# Patient Record
Sex: Female | Born: 1975
Health system: Southern US, Community
[De-identification: ages and names within clinical notes are randomized; demographics above are authoritative.]

## PROBLEM LIST (undated history)

## (undated) DIAGNOSIS — E039 Hypothyroidism, unspecified: Secondary | ICD-10-CM

## (undated) DIAGNOSIS — E119 Type 2 diabetes mellitus without complications: Secondary | ICD-10-CM

## (undated) DIAGNOSIS — I10 Essential (primary) hypertension: Secondary | ICD-10-CM

## (undated) DIAGNOSIS — R519 Headache, unspecified: Secondary | ICD-10-CM

## (undated) DIAGNOSIS — R7303 Prediabetes: Secondary | ICD-10-CM

## (undated) DIAGNOSIS — D259 Leiomyoma of uterus, unspecified: Secondary | ICD-10-CM

## (undated) DIAGNOSIS — E079 Disorder of thyroid, unspecified: Secondary | ICD-10-CM

## (undated) DIAGNOSIS — N9489 Other specified conditions associated with female genital organs and menstrual cycle: Secondary | ICD-10-CM

## (undated) HISTORY — PX: ABDOMINAL HYSTERECTOMY: SHX81

## (undated) HISTORY — DX: Hypothyroidism, unspecified: E03.9

## (undated) HISTORY — DX: Other specified conditions associated with female genital organs and menstrual cycle: N94.89

## (undated) HISTORY — DX: Leiomyoma of uterus, unspecified: D25.9

## (undated) HISTORY — DX: Prediabetes: R73.03

---

## 2018-09-14 DIAGNOSIS — M7062 Trochanteric bursitis, left hip: Secondary | ICD-10-CM | POA: Insufficient documentation

## 2018-09-14 DIAGNOSIS — M25552 Pain in left hip: Secondary | ICD-10-CM | POA: Insufficient documentation

## 2018-12-22 ENCOUNTER — Encounter (HOSPITAL_COMMUNITY): Payer: Self-pay

## 2018-12-22 ENCOUNTER — Ambulatory Visit (HOSPITAL_COMMUNITY)
Admission: EM | Admit: 2018-12-22 | Discharge: 2018-12-22 | Disposition: A | Discharge status: Home / Self Care | Payer: 59 | Attending: Family Medicine | Admitting: Family Medicine

## 2018-12-22 ENCOUNTER — Other Ambulatory Visit: Payer: Self-pay

## 2018-12-22 DIAGNOSIS — R509 Fever, unspecified: Secondary | ICD-10-CM | POA: Diagnosis not present

## 2018-12-22 DIAGNOSIS — Z711 Person with feared health complaint in whom no diagnosis is made: Secondary | ICD-10-CM

## 2018-12-22 HISTORY — DX: Type 2 diabetes mellitus without complications: E11.9

## 2018-12-22 HISTORY — DX: Disorder of thyroid, unspecified: E07.9

## 2018-12-22 NOTE — ED Notes (Signed)
Patient verbalizes understanding of discharge instructions. Opportunity for questioning and answers were provided. Patient discharged from UCC by RN.  

## 2018-12-22 NOTE — ED Triage Notes (Signed)
Patient presents to Urgent Care with complaints of low grade fever, for which she has been taking Tylenol since four days ago.

## 2018-12-22 NOTE — Discharge Instructions (Addendum)
Get plenty of rest and push fluids Take OTC tylenol as needed for fever when greater than 100F, body aches, and/or chills Follow up with PCP or Community health as needed Please call or go to the ED if you have any new or worsening symptoms such as high fever, worsening cough, shortness of breath, chest tightness, chest pain, turning blue, changes in mental status, etc..Marland Kitchen

## 2018-12-22 NOTE — ED Provider Notes (Signed)
[place]   [id number] 12/22/18 Arrival Time: 4098  CC: FEVER  SUBJECTIVE: History from: patient.  Alyssa Oliver is a 43 y.o. female hx significant for DM, and thyroid disease, who presents with complaint of subjective low grade fever that began 4 days ago.  Tmax at home was 99, 98.3 in office today.  Last dose of tylenol last night.  States temperature ranges from 97-99. Admits to possible sick exposure to daughter. Denies travel.  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 Has tried OTC tylenol with relief.  Denies aggravating or alleviating factors. Denies chills, fatigue, body aches, rhinorrhea, congestion, sore throat, cough, chest pain, chest tightness, SOB, abdominal pain, changes in bowel or bladder function.     Received flu shot this year: yes.  ROS: As per HPI.  Past Medical History:  Diagnosis Date  . Diabetes mellitus without complication ([place])   . Thyroid disease    History reviewed. No pertinent surgical history. No Known Allergies No current facility-administered medications on file prior to encounter.    Current Outpatient Medications on File Prior to Encounter  Medication Sig Dispense Refill  . levothyroxine (SYNTHROID, LEVOTHROID) 50 MCG tablet Take 50 mcg by mouth daily before breakfast.    . metFORMIN (GLUCOPHAGE) 500 MG tablet Take by mouth 2 (two) times daily with a meal.     Social History   Socioeconomic History  . Marital status: Married    Spouse name: Not on file  . Number of children: Not on file  . Years of education: Not on file  . Highest education level: Not on file  Occupational History  . Not on file  Social Needs  . Financial resource strain: Not on file  . Food insecurity:    Worry: Not on file    Inability: Not on file  . Transportation needs:    Medical: Not on file    Non-medical: Not on file  Tobacco Use  . Smoking status: Never Smoker  Substance and Sexual Activity  . Alcohol use: Not Currently  . Drug use: Not  on file  . Sexual activity: Not Currently  Lifestyle  . Physical activity:    Days per week: Not on file    Minutes per session: Not on file  . Stress: Not on file  Relationships  . Social connections:    Talks on phone: Not on file    Gets together: Not on file    Attends religious service: Not on file    Active member of club or organization: Not on file    Attends meetings of clubs or organizations: Not on file    Relationship status: Not on file  . Intimate partner violence:    Fear of current or ex partner: Not on file    Emotionally abused: Not on file    Physically abused: Not on file    Forced sexual activity: Not on file  Other Topics Concern  . Not on file  Social History Narrative  . Not on file   History reviewed. No pertinent family history.  OBJECTIVE:  Vitals:   12/22/18 1450 12/22/18 1451  BP:  (!) 147/77  Pulse: 95   Resp: 18   Temp: 98.3 F (36.8 C)   TempSrc: Oral   SpO2: 100%      General appearance: alert; well appearing; nontoxic appearance HEENT: NCAT; Ears: EACs clear, TMs pearly gray; Eyes: EOM grossly intact. Nose: no rhinorrhea without nasal flaring, turbinates mildly boggy; Throat: oropharynx clear, tonsils  not enlarged or erythematous, uvula midline Neck: supple without LAD Lungs: CTA bilaterally without adventitious breath sounds; normal respiratory effort, no belly breathing or accessory muscle use; no cough present Heart: regular rate and rhythm.  Radial pulses 2+ symmetrical bilaterally Skin: warm and dry; no obvious rashes Psychological: alert and cooperative; normal mood and affect appropriate for age   ASSESSMENT & PLAN:  1. Physically well but worried     Get plenty of rest and push fluids Take OTC tylenol as needed for fever when greater than 100 F, body aches, and/or chills Follow up with PCP or Community health as needed Please call or go to the ED if you have any new or worsening symptoms such as high fever, worsening  cough, shortness of breath, chest tightness, chest pain, turning blue, changes in mental status, etc...  Reviewed expectations re: course of current medical issues. Questions answered. Outlined signs and symptoms indicating need for more acute intervention. Patient verbalized understanding. After Visit Summary given.          Lestine Box, PA-C 12/22/18 1533

## 2019-03-16 ENCOUNTER — Encounter (HOSPITAL_COMMUNITY): Payer: Self-pay

## 2019-03-16 ENCOUNTER — Emergency Department (HOSPITAL_COMMUNITY): Payer: No Typology Code available for payment source

## 2019-03-16 ENCOUNTER — Other Ambulatory Visit: Payer: Self-pay

## 2019-03-16 ENCOUNTER — Observation Stay (HOSPITAL_COMMUNITY)
Admission: EM | Admit: 2019-03-16 | Discharge: 2019-03-18 | Disposition: A | Discharge status: Home / Self Care | Payer: No Typology Code available for payment source | Attending: Surgery | Admitting: Surgery

## 2019-03-16 ENCOUNTER — Ambulatory Visit (INDEPENDENT_AMBULATORY_CARE_PROVIDER_SITE_OTHER)
Admission: EM | Admit: 2019-03-16 | Discharge: 2019-03-16 | Disposition: A | Discharge status: Home / Self Care | Payer: No Typology Code available for payment source | Source: Home / Self Care

## 2019-03-16 DIAGNOSIS — R1011 Right upper quadrant pain: Secondary | ICD-10-CM

## 2019-03-16 DIAGNOSIS — K3580 Unspecified acute appendicitis: Secondary | ICD-10-CM | POA: Diagnosis not present

## 2019-03-16 DIAGNOSIS — E669 Obesity, unspecified: Secondary | ICD-10-CM | POA: Insufficient documentation

## 2019-03-16 DIAGNOSIS — Z7984 Long term (current) use of oral hypoglycemic drugs: Secondary | ICD-10-CM | POA: Insufficient documentation

## 2019-03-16 DIAGNOSIS — Z79890 Hormone replacement therapy (postmenopausal): Secondary | ICD-10-CM | POA: Diagnosis not present

## 2019-03-16 DIAGNOSIS — E039 Hypothyroidism, unspecified: Secondary | ICD-10-CM | POA: Insufficient documentation

## 2019-03-16 DIAGNOSIS — Z1159 Encounter for screening for other viral diseases: Secondary | ICD-10-CM | POA: Insufficient documentation

## 2019-03-16 DIAGNOSIS — E119 Type 2 diabetes mellitus without complications: Secondary | ICD-10-CM | POA: Insufficient documentation

## 2019-03-16 DIAGNOSIS — Z79899 Other long term (current) drug therapy: Secondary | ICD-10-CM | POA: Insufficient documentation

## 2019-03-16 DIAGNOSIS — Z6838 Body mass index (BMI) 38.0-38.9, adult: Secondary | ICD-10-CM | POA: Diagnosis not present

## 2019-03-16 DIAGNOSIS — R1010 Upper abdominal pain, unspecified: Secondary | ICD-10-CM

## 2019-03-16 LAB — URINALYSIS, ROUTINE W REFLEX MICROSCOPIC
Bilirubin Urine: NEGATIVE
Glucose, UA: NEGATIVE mg/dL
Hgb urine dipstick: NEGATIVE
Ketones, ur: 5 mg/dL — AB
Leukocytes,Ua: NEGATIVE
Nitrite: NEGATIVE
Protein, ur: NEGATIVE mg/dL
Specific Gravity, Urine: 1.019 (ref 1.005–1.030)
pH: 8 (ref 5.0–8.0)

## 2019-03-16 LAB — CBC
HCT: 31.6 % — ABNORMAL LOW (ref 36.0–46.0)
Hemoglobin: 9.9 g/dL — ABNORMAL LOW (ref 12.0–15.0)
MCH: 27.2 pg (ref 26.0–34.0)
MCHC: 31.3 g/dL (ref 30.0–36.0)
MCV: 86.8 fL (ref 80.0–100.0)
Platelets: 434 10*3/uL — ABNORMAL HIGH (ref 150–400)
RBC: 3.64 MIL/uL — ABNORMAL LOW (ref 3.87–5.11)
RDW: 18.9 % — ABNORMAL HIGH (ref 11.5–15.5)
WBC: 10.2 10*3/uL (ref 4.0–10.5)
nRBC: 0 % (ref 0.0–0.2)

## 2019-03-16 LAB — COMPREHENSIVE METABOLIC PANEL
ALT: 14 U/L (ref 0–44)
AST: 17 U/L (ref 15–41)
Albumin: 3.9 g/dL (ref 3.5–5.0)
Alkaline Phosphatase: 54 U/L (ref 38–126)
Anion gap: 8 (ref 5–15)
BUN: 7 mg/dL (ref 6–20)
CO2: 24 mmol/L (ref 22–32)
Calcium: 9.1 mg/dL (ref 8.9–10.3)
Chloride: 103 mmol/L (ref 98–111)
Creatinine, Ser: 0.57 mg/dL (ref 0.44–1.00)
GFR calc Af Amer: >60 mL/min (ref >60)
GFR calc non Af Amer: >60 mL/min (ref >60)
Glucose, Bld: 120 mg/dL — ABNORMAL HIGH (ref 70–99)
Potassium: 3.4 mmol/L — ABNORMAL LOW (ref 3.5–5.1)
Sodium: 135 mmol/L (ref 135–145)
Total Bilirubin: 0.2 mg/dL — ABNORMAL LOW (ref 0.3–1.2)
Total Protein: 6.9 g/dL (ref 6.5–8.1)

## 2019-03-16 LAB — I-STAT BETA HCG BLOOD, ED (MC, WL, AP ONLY): I-stat hCG, quantitative: <5 m[IU]/mL (ref <5)

## 2019-03-16 LAB — LIPASE, BLOOD: Lipase: 23 U/L (ref 11–51)

## 2019-03-16 MED ORDER — MORPHINE SULFATE (PF) 2 MG/ML IV SOLN
2.0000 mg | Freq: Once | INTRAVENOUS | Status: AC
  Administered 2019-03-16: 2 mg via INTRAVENOUS
  Filled 2019-03-16: qty 1

## 2019-03-16 MED ORDER — SODIUM CHLORIDE 0.9% FLUSH
3.0000 mL | Freq: Once | INTRAVENOUS | Status: AC
  Administered 2019-03-17: 3 mL via INTRAVENOUS

## 2019-03-16 MED ORDER — ALUM & MAG HYDROXIDE-SIMETH 200-200-20 MG/5ML PO SUSP
30.0000 mL | Freq: Once | ORAL | Status: AC
  Administered 2019-03-16: 30 mL via ORAL

## 2019-03-16 MED ORDER — ALUM & MAG HYDROXIDE-SIMETH 200-200-20 MG/5ML PO SUSP
ORAL | Status: AC
  Filled 2019-03-16: qty 30

## 2019-03-16 MED ORDER — LIDOCAINE VISCOUS HCL 2 % MT SOLN
15.0000 mL | Freq: Once | OROMUCOSAL | Status: AC
  Administered 2019-03-16: 15 mL via ORAL

## 2019-03-16 MED ORDER — LIDOCAINE VISCOUS HCL 2 % MT SOLN
OROMUCOSAL | Status: AC
  Filled 2019-03-16: qty 15

## 2019-03-16 NOTE — ED Provider Notes (Addendum)
[facility]    CSN: [id number] Arrival date & time: 03/16/19  [place]     History   Chief Complaint Chief Complaint  Patient presents with  . Abdominal Pain    HPI Alyssa Oliver is a 43 y.o. female history of DM type II, presenting today for evaluation of abdominal pain.  Patient states that her pain began today around 12 PM.  Since pain is worsened.  Describes pain as a 10 out of 10.  Located in her upper abdomen.  Describes it as sharp.  Denies radiation to back, but occasionally feeling cramping sensation in her back.  Has had some associated nausea, but denies vomiting.  Denies change in bowel movements, constipation or diarrhea.  Denies blood in stool.  Denies previous surgeries on abdomen.  She has taken some Mylanta without relief.  Notes that she ate salmon last night and was unsure if this could be related to her pain.  Denies associated chest pain or shortness of breath.  HPI  Past Medical History:  Diagnosis Date  . Diabetes mellitus without complication ([place])   . Thyroid disease     There are no active problems to display for this patient.   History reviewed. No pertinent surgical history.  OB History   No obstetric history on file.      Home Medications    Prior to Admission medications   Medication Sig Start Date End Date Taking? Authorizing Provider  levothyroxine (SYNTHROID, LEVOTHROID) 50 MCG tablet Take 50 mcg by mouth daily before breakfast.    [provider]  metFORMIN (GLUCOPHAGE) 500 MG tablet Take by mouth 2 (two) times daily with a meal.    [provider]    Family History History reviewed. No pertinent family history.  Social History Social History   Tobacco Use  . Smoking status: Never Smoker  . Smokeless tobacco: Never Used  Substance Use Topics  . Alcohol use: Not Currently  . Drug use: Not on file     Allergies   Patient has no known allergies.   Review of Systems Review of Systems   Constitutional: Negative for activity change, appetite change, chills, fatigue and fever.  HENT: Negative for congestion, ear pain, rhinorrhea, sinus pressure, sore throat and trouble swallowing.   Eyes: Negative for discharge and redness.  Respiratory: Negative for cough, chest tightness and shortness of breath.   Cardiovascular: Negative for chest pain.  Gastrointestinal: Positive for abdominal pain. Negative for diarrhea, nausea and vomiting.  Musculoskeletal: Negative for myalgias.  Skin: Negative for rash.  Neurological: Negative for dizziness, light-headedness and headaches.     Physical Exam Triage Vital Signs ED Triage Vitals  Enc Vitals Group     BP 03/16/19 1835 139/63     Pulse Rate 03/16/19 1835 78     Resp 03/16/19 1835 18     Temp 03/16/19 1835 98.2 F (36.8 C)     Temp src --      SpO2 03/16/19 1835 100 %     Weight 03/16/19 1833 230 lb (104.3 kg)     Height --      Head Circumference --      Peak Flow --      Pain Score 03/16/19 1833 10     Pain Loc --      Pain Edu? --      Excl. in [place]? --    No data found.  Updated Vital Signs BP 139/63   Pulse 78   Temp  98.2 F (36.8 C)   Resp 18   Wt 230 lb (104.3 kg)   LMP 02/24/2019   SpO2 100%   Visual Acuity Right Eye Distance:   Left Eye Distance:   Bilateral Distance:    Right Eye Near:   Left Eye Near:    Bilateral Near:     Physical Exam Vitals signs and nursing note reviewed.  Constitutional:      Appearance: She is well-developed.     Comments: Appears slightly uncomfortable  HENT:     Head: Normocephalic and atraumatic.     Mouth/Throat:     Comments: Oral mucosa pink and moist, no tonsillar enlargement or exudate. Posterior pharynx patent and nonerythematous, no uvula deviation or swelling. Normal phonation. Eyes:     Conjunctiva/sclera: Conjunctivae normal.  Neck:     Musculoskeletal: Neck supple.  Cardiovascular:     Rate and Rhythm: Normal rate and regular rhythm.     Heart  sounds: No murmur.  Pulmonary:     Effort: Pulmonary effort is normal. No respiratory distress.     Breath sounds: Normal breath sounds.     Comments: Slightly tachypnic, breathing short and quick, CTABL, no wheezing, rales or other adventitious sounds auscultated;  Abdominal:     Palpations: Abdomen is soft.     Tenderness: There is abdominal tenderness.     Comments: Diffuse tenderness throughout upper abdomen, more focal in mid abdomen, negative rebound; nontender in bilateral lower quadrants  Skin:    General: Skin is warm and dry.  Neurological:     Mental Status: She is alert.      UC Treatments / Results  Labs (all labs ordered are listed, but only abnormal results are displayed) Labs Reviewed - No data to display  EKG None  Radiology No results found.  Procedures Procedures (including critical care time)  Medications Ordered in UC Medications  alum & mag hydroxide-simeth (MAALOX/MYLANTA) 200-200-20 MG/5ML suspension 30 mL (30 mLs Oral Given 03/16/19 1907)    And  lidocaine (XYLOCAINE) 2 % viscous mouth solution 15 mL (15 mLs Oral Given 03/16/19 1907)  alum & mag hydroxide-simeth (MAALOX/MYLANTA) 200-200-20 MG/5ML suspension (has no administration in time range)  lidocaine (XYLOCAINE) 2 % viscous mouth solution (has no administration in time range)    Initial Impression / Assessment and Plan / UC Course  I have reviewed the triage vital signs and the nursing notes.  Pertinent labs & imaging results that were available during my care of the patient were reviewed by me and considered in my medical decision making (see chart for details).     Patient given GI cocktail. Mild improvement but still feeling faint and uncomfortable. Recommending further eval in ED for blood work, potential scan to r/o other causes given her acute onset today, to ensure stability for discharge. Transported via Engineer, civil (consulting). Final Clinical Impressions(s) / UC Diagnoses   Final diagnoses:   Pain of upper abdomen     Discharge Instructions     To ED    ED Prescriptions    None     Controlled Substance Prescriptions [place] Controlled Substance Registry consulted? Not Applicable   Janith Lima, PA-C 03/16/19 1929    Janith Lima, [place] 03/16/19 1936

## 2019-03-16 NOTE — ED Triage Notes (Signed)
Pt states she has abdominal pain this started at 12 pm today.

## 2019-03-16 NOTE — ED Triage Notes (Signed)
Pt sent to ED from u/c.  Onset 12 noon today upper abd pain, nausea.  No vomiting.  Pt ate salmon yesterday and not sure if related.  No cough/sore throat/fever.  Pt was given GI cocktail at u/c with mild relief.

## 2019-03-16 NOTE — Discharge Instructions (Signed)
To ED

## 2019-03-17 ENCOUNTER — Observation Stay (HOSPITAL_COMMUNITY): Payer: No Typology Code available for payment source | Admitting: Certified Registered Nurse Anesthetist

## 2019-03-17 ENCOUNTER — Encounter (HOSPITAL_COMMUNITY)
Admission: EM | Disposition: A | Discharge status: Home / Self Care | Payer: Self-pay | Source: Home / Self Care | Attending: Emergency Medicine

## 2019-03-17 ENCOUNTER — Emergency Department (HOSPITAL_COMMUNITY): Payer: No Typology Code available for payment source

## 2019-03-17 ENCOUNTER — Encounter (HOSPITAL_COMMUNITY): Payer: Self-pay | Admitting: Surgery

## 2019-03-17 DIAGNOSIS — K3580 Unspecified acute appendicitis: Secondary | ICD-10-CM

## 2019-03-17 HISTORY — PX: APPENDECTOMY: SHX54

## 2019-03-17 HISTORY — DX: Unspecified acute appendicitis: K35.80

## 2019-03-17 HISTORY — PX: LAPAROSCOPIC APPENDECTOMY: SHX408

## 2019-03-17 LAB — CBC
HCT: 31.7 % — ABNORMAL LOW (ref 36.0–46.0)
Hemoglobin: 9.9 g/dL — ABNORMAL LOW (ref 12.0–15.0)
MCH: 27 pg (ref 26.0–34.0)
MCHC: 31.2 g/dL (ref 30.0–36.0)
MCV: 86.6 fL (ref 80.0–100.0)
Platelets: 386 10*3/uL (ref 150–400)
RBC: 3.66 MIL/uL — ABNORMAL LOW (ref 3.87–5.11)
RDW: 18.9 % — ABNORMAL HIGH (ref 11.5–15.5)
WBC: 8.8 10*3/uL (ref 4.0–10.5)
nRBC: 0 % (ref 0.0–0.2)

## 2019-03-17 LAB — BASIC METABOLIC PANEL
Anion gap: 11 (ref 5–15)
BUN: 5 mg/dL — ABNORMAL LOW (ref 6–20)
CO2: 21 mmol/L — ABNORMAL LOW (ref 22–32)
Calcium: 8.8 mg/dL — ABNORMAL LOW (ref 8.9–10.3)
Chloride: 105 mmol/L (ref 98–111)
Creatinine, Ser: 0.5 mg/dL (ref 0.44–1.00)
GFR calc Af Amer: >60 mL/min (ref >60)
GFR calc non Af Amer: >60 mL/min (ref >60)
Glucose, Bld: 108 mg/dL — ABNORMAL HIGH (ref 70–99)
Potassium: 3.5 mmol/L (ref 3.5–5.1)
Sodium: 137 mmol/L (ref 135–145)

## 2019-03-17 LAB — SURGICAL PCR SCREEN
MRSA, PCR: NEGATIVE
Staphylococcus aureus: NEGATIVE

## 2019-03-17 LAB — SARS CORONAVIRUS 2: SARS Coronavirus 2: NOT DETECTED

## 2019-03-17 LAB — GLUCOSE, CAPILLARY
Glucose-Capillary: 95 mg/dL (ref 70–99)
Glucose-Capillary: 94 mg/dL (ref 70–99)
Glucose-Capillary: 122 mg/dL — ABNORMAL HIGH (ref 70–99)
Glucose-Capillary: 101 mg/dL — ABNORMAL HIGH (ref 70–99)
Glucose-Capillary: 94 mg/dL (ref 70–99)

## 2019-03-17 LAB — HIV ANTIBODY (ROUTINE TESTING W REFLEX): HIV Screen 4th Generation wRfx: NONREACTIVE

## 2019-03-17 SURGERY — APPENDECTOMY, LAPAROSCOPIC
Anesthesia: General

## 2019-03-17 MED ORDER — SUGAMMADEX SODIUM 200 MG/2ML IV SOLN
INTRAVENOUS | Status: DC | PRN
  Administered 2019-03-17: 210.2 mg via INTRAVENOUS

## 2019-03-17 MED ORDER — OXYCODONE HCL 5 MG PO TABS
5.0000 mg | ORAL_TABLET | Freq: Four times a day (QID) | ORAL | Status: DC | PRN
  Administered 2019-03-17 – 2019-03-18 (×2): 5 mg via ORAL
  Filled 2019-03-17 (×2): qty 1

## 2019-03-17 MED ORDER — LEVOTHYROXINE SODIUM 50 MCG PO TABS
50.0000 ug | ORAL_TABLET | Freq: Every day | ORAL | Status: DC
  Administered 2019-03-17 – 2019-03-18 (×2): 50 ug via ORAL
  Filled 2019-03-17 (×2): qty 1

## 2019-03-17 MED ORDER — METRONIDAZOLE IN NACL 5-0.79 MG/ML-% IV SOLN
500.0000 mg | Freq: Three times a day (TID) | INTRAVENOUS | Status: DC
  Administered 2019-03-17 – 2019-03-18 (×3): 500 mg via INTRAVENOUS
  Filled 2019-03-17 (×2): qty 100

## 2019-03-17 MED ORDER — MORPHINE SULFATE (PF) 4 MG/ML IV SOLN
4.0000 mg | Freq: Once | INTRAVENOUS | Status: AC
  Administered 2019-03-17: 4 mg via INTRAVENOUS
  Filled 2019-03-17: qty 1

## 2019-03-17 MED ORDER — LIDOCAINE 2% (20 MG/ML) 5 ML SYRINGE
INTRAMUSCULAR | Status: DC | PRN
  Administered 2019-03-17: 80 mg via INTRAVENOUS

## 2019-03-17 MED ORDER — ONDANSETRON HCL 4 MG/2ML IJ SOLN: 4.0000 mg | Freq: Four times a day (QID) | INTRAMUSCULAR | Status: DC | PRN

## 2019-03-17 MED ORDER — HYDROMORPHONE HCL 1 MG/ML IJ SOLN: 0.5000 mg | INTRAMUSCULAR | Status: DC | PRN

## 2019-03-17 MED ORDER — ONDANSETRON 4 MG PO TBDP: 4.0000 mg | ORAL_TABLET | Freq: Four times a day (QID) | ORAL | Status: DC | PRN

## 2019-03-17 MED ORDER — ROCURONIUM BROMIDE 50 MG/5ML IV SOSY
PREFILLED_SYRINGE | INTRAVENOUS | Status: DC | PRN
  Administered 2019-03-17: 30 mg via INTRAVENOUS

## 2019-03-17 MED ORDER — LACTATED RINGERS IV SOLN
INTRAVENOUS | Status: DC
  Administered 2019-03-17: 10:00:00 via INTRAVENOUS

## 2019-03-17 MED ORDER — METRONIDAZOLE IN NACL 500-0.74 MG/100ML-% IV SOLN
500.0000 mg | INTRAVENOUS | Status: DC
  Filled 2019-03-17: qty 100

## 2019-03-17 MED ORDER — ENOXAPARIN SODIUM 40 MG/0.4ML ~~LOC~~ SOLN
40.0000 mg | SUBCUTANEOUS | Status: DC
  Administered 2019-03-17: 40 mg via SUBCUTANEOUS
  Filled 2019-03-17: qty 0.4

## 2019-03-17 MED ORDER — 0.9 % SODIUM CHLORIDE (POUR BTL) OPTIME
TOPICAL | Status: DC | PRN
  Administered 2019-03-17: 1000 mL

## 2019-03-17 MED ORDER — METHOCARBAMOL 500 MG PO TABS
500.0000 mg | ORAL_TABLET | Freq: Four times a day (QID) | ORAL | Status: DC | PRN
  Administered 2019-03-17: 500 mg via ORAL
  Filled 2019-03-17: qty 1

## 2019-03-17 MED ORDER — FENTANYL CITRATE (PF) 250 MCG/5ML IJ SOLN
INTRAMUSCULAR | Status: AC
  Filled 2019-03-17: qty 5

## 2019-03-17 MED ORDER — MIDAZOLAM HCL 2 MG/2ML IJ SOLN
INTRAMUSCULAR | Status: AC
  Filled 2019-03-17: qty 2

## 2019-03-17 MED ORDER — SODIUM CHLORIDE 0.9 % IV SOLN
INTRAVENOUS | Status: DC
  Administered 2019-03-17: 03:00:00 via INTRAVENOUS

## 2019-03-17 MED ORDER — ACETAMINOPHEN 500 MG PO TABS
1000.0000 mg | ORAL_TABLET | Freq: Four times a day (QID) | ORAL | Status: DC
  Administered 2019-03-17 – 2019-03-18 (×5): 1000 mg via ORAL
  Filled 2019-03-17 (×7): qty 2

## 2019-03-17 MED ORDER — METOPROLOL TARTRATE 5 MG/5ML IV SOLN: 5.0000 mg | Freq: Four times a day (QID) | INTRAVENOUS | Status: DC | PRN

## 2019-03-17 MED ORDER — HYDRALAZINE HCL 20 MG/ML IJ SOLN: 10.0000 mg | INTRAMUSCULAR | Status: DC | PRN

## 2019-03-17 MED ORDER — PROMETHAZINE HCL 25 MG/ML IJ SOLN: 6.2500 mg | INTRAMUSCULAR | Status: DC | PRN

## 2019-03-17 MED ORDER — DIPHENHYDRAMINE HCL 25 MG PO CAPS: 25.0000 mg | ORAL_CAPSULE | Freq: Four times a day (QID) | ORAL | Status: DC | PRN

## 2019-03-17 MED ORDER — PROPOFOL 10 MG/ML IV BOLUS
INTRAVENOUS | Status: DC | PRN
  Administered 2019-03-17: 200 mg via INTRAVENOUS

## 2019-03-17 MED ORDER — SUCCINYLCHOLINE CHLORIDE 200 MG/10ML IV SOSY
PREFILLED_SYRINGE | INTRAVENOUS | Status: DC | PRN
  Administered 2019-03-17: 140 mg via INTRAVENOUS

## 2019-03-17 MED ORDER — TRAMADOL HCL 50 MG PO TABS: 50.0000 mg | ORAL_TABLET | Freq: Four times a day (QID) | ORAL | Status: DC | PRN

## 2019-03-17 MED ORDER — DIPHENHYDRAMINE HCL 50 MG/ML IJ SOLN: 25.0000 mg | Freq: Four times a day (QID) | INTRAMUSCULAR | Status: DC | PRN

## 2019-03-17 MED ORDER — PROPOFOL 10 MG/ML IV BOLUS
INTRAVENOUS | Status: AC
  Filled 2019-03-17: qty 20

## 2019-03-17 MED ORDER — BUPIVACAINE-EPINEPHRINE 0.5% -1:200000 IJ SOLN
INTRAMUSCULAR | Status: DC | PRN
  Administered 2019-03-17: 20 mL

## 2019-03-17 MED ORDER — MIDAZOLAM HCL 5 MG/5ML IJ SOLN
INTRAMUSCULAR | Status: DC | PRN
  Administered 2019-03-17: 2 mg via INTRAVENOUS

## 2019-03-17 MED ORDER — ONDANSETRON HCL 4 MG/2ML IJ SOLN
INTRAMUSCULAR | Status: DC | PRN
  Administered 2019-03-17: 4 mg via INTRAVENOUS

## 2019-03-17 MED ORDER — FENTANYL CITRATE (PF) 100 MCG/2ML IJ SOLN: 25.0000 ug | INTRAMUSCULAR | Status: DC | PRN

## 2019-03-17 MED ORDER — METRONIDAZOLE IN NACL 5-0.79 MG/ML-% IV SOLN
500.0000 mg | Freq: Once | INTRAVENOUS | Status: AC
  Administered 2019-03-17: 500 mg via INTRAVENOUS
  Filled 2019-03-17: qty 100

## 2019-03-17 MED ORDER — SODIUM CHLORIDE 0.9 % IV SOLN
2.0000 g | Freq: Once | INTRAVENOUS | Status: AC
  Administered 2019-03-17: 2 g via INTRAVENOUS
  Filled 2019-03-17: qty 20

## 2019-03-17 MED ORDER — BISACODYL 10 MG RE SUPP: 10.0000 mg | Freq: Every day | RECTAL | Status: DC | PRN

## 2019-03-17 MED ORDER — BUPIVACAINE-EPINEPHRINE (PF) 0.5% -1:200000 IJ SOLN
INTRAMUSCULAR | Status: AC
  Filled 2019-03-17: qty 30

## 2019-03-17 MED ORDER — SODIUM CHLORIDE 0.9 % IV SOLN
2.0000 g | INTRAVENOUS | Status: DC
  Administered 2019-03-18: 2 g via INTRAVENOUS
  Filled 2019-03-17 (×2): qty 20

## 2019-03-17 MED ORDER — INSULIN ASPART 100 UNIT/ML ~~LOC~~ SOLN: 0.0000 [IU] | SUBCUTANEOUS | Status: DC

## 2019-03-17 MED ORDER — IOHEXOL 300 MG/ML  SOLN
125.0000 mL | Freq: Once | INTRAMUSCULAR | Status: AC | PRN
  Administered 2019-03-17: 125 mL via INTRAVENOUS

## 2019-03-17 MED ORDER — SODIUM CHLORIDE 0.9 % IR SOLN
Status: DC | PRN
  Administered 2019-03-17: 1000 mL

## 2019-03-17 MED ORDER — FENTANYL CITRATE (PF) 250 MCG/5ML IJ SOLN
INTRAMUSCULAR | Status: DC | PRN
  Administered 2019-03-17: 50 ug via INTRAVENOUS
  Administered 2019-03-17: 100 ug via INTRAVENOUS
  Administered 2019-03-17: 50 ug via INTRAVENOUS

## 2019-03-17 MED ORDER — DOCUSATE SODIUM 100 MG PO CAPS
100.0000 mg | ORAL_CAPSULE | Freq: Two times a day (BID) | ORAL | Status: DC
  Administered 2019-03-17: 100 mg via ORAL
  Filled 2019-03-17 (×2): qty 1

## 2019-03-17 MED ORDER — SODIUM CHLORIDE 0.9 % IV SOLN: INTRAVENOUS | Status: DC

## 2019-03-17 MED ORDER — OXYCODONE HCL 5 MG PO TABS: 5.0000 mg | ORAL_TABLET | Freq: Once | ORAL | Status: DC | PRN

## 2019-03-17 MED ORDER — SODIUM CHLORIDE 0.9 % IV BOLUS
1000.0000 mL | Freq: Once | INTRAVENOUS | Status: AC
  Administered 2019-03-17: 1000 mL via INTRAVENOUS

## 2019-03-17 MED ORDER — OXYCODONE HCL 5 MG/5ML PO SOLN: 5.0000 mg | Freq: Once | ORAL | Status: DC | PRN

## 2019-03-17 MED ORDER — HYDROMORPHONE HCL 1 MG/ML IJ SOLN: 1.0000 mg | INTRAMUSCULAR | Status: DC | PRN

## 2019-03-17 MED ORDER — KETOROLAC TROMETHAMINE 30 MG/ML IJ SOLN: 30.0000 mg | Freq: Four times a day (QID) | INTRAMUSCULAR | Status: DC | PRN

## 2019-03-17 MED ORDER — DEXAMETHASONE SODIUM PHOSPHATE 4 MG/ML IJ SOLN
INTRAMUSCULAR | Status: DC | PRN
  Administered 2019-03-17: 5 mg via INTRAVENOUS

## 2019-03-17 SURGICAL SUPPLY — 42 items
APPLIER CLIP 5 13 M/L LIGAMAX5 (MISCELLANEOUS)
APPLIER CLIP ROT 10 11.4 M/L (STAPLE) ×2
CANISTER SUCT 3000ML PPV (MISCELLANEOUS) ×2 IMPLANT
CHLORAPREP W/TINT 26ML (MISCELLANEOUS) ×2 IMPLANT
CLIP APPLIE 5 13 M/L LIGAMAX5 (MISCELLANEOUS) IMPLANT
CLIP APPLIE ROT 10 11.4 M/L (STAPLE) ×1 IMPLANT
COVER SURGICAL LIGHT HANDLE (MISCELLANEOUS) ×2 IMPLANT
COVER WAND RF STERILE (DRAPES) ×2 IMPLANT
CUTTER FLEX LINEAR 45M (STAPLE) IMPLANT
DERMABOND ADHESIVE PROPEN (GAUZE/BANDAGES/DRESSINGS) ×1
DERMABOND ADVANCED (GAUZE/BANDAGES/DRESSINGS) ×1
DERMABOND ADVANCED .7 DNX12 (GAUZE/BANDAGES/DRESSINGS) ×1 IMPLANT
DERMABOND ADVANCED .7 DNX6 (GAUZE/BANDAGES/DRESSINGS) ×1 IMPLANT
ELECT REM PT RETURN 9FT ADLT (ELECTROSURGICAL) ×2
ELECTRODE REM PT RTRN 9FT ADLT (ELECTROSURGICAL) ×1 IMPLANT
GLOVE BIOGEL M 6.5 STRL (GLOVE) ×4 IMPLANT
GLOVE BIOGEL PI IND STRL 6.5 (GLOVE) ×2 IMPLANT
GLOVE BIOGEL PI INDICATOR 6.5 (GLOVE) ×2
GLOVE SURG SIGNA 7.5 PF LTX (GLOVE) ×2 IMPLANT
GOWN STRL REUS W/ TWL LRG LVL3 (GOWN DISPOSABLE) ×2 IMPLANT
GOWN STRL REUS W/ TWL XL LVL3 (GOWN DISPOSABLE) ×1 IMPLANT
GOWN STRL REUS W/TWL LRG LVL3 (GOWN DISPOSABLE) ×2
GOWN STRL REUS W/TWL XL LVL3 (GOWN DISPOSABLE) ×1
KIT BASIN OR (CUSTOM PROCEDURE TRAY) ×2 IMPLANT
KIT TURNOVER KIT B (KITS) ×2 IMPLANT
NS IRRIG 1000ML POUR BTL (IV SOLUTION) ×2 IMPLANT
PAD ARMBOARD 7.5X6 YLW CONV (MISCELLANEOUS) ×4 IMPLANT
POUCH SPECIMEN RETRIEVAL 10MM (ENDOMECHANICALS) ×2 IMPLANT
RELOAD 45 VASCULAR/THIN (ENDOMECHANICALS) IMPLANT
RELOAD STAPLE TA45 3.5 REG BLU (ENDOMECHANICALS) ×2 IMPLANT
SET IRRIG TUBING LAPAROSCOPIC (IRRIGATION / IRRIGATOR) ×2 IMPLANT
SET TUBE SMOKE EVAC HIGH FLOW (TUBING) IMPLANT
SHEARS HARMONIC ACE PLUS 36CM (ENDOMECHANICALS) ×2 IMPLANT
SLEEVE ENDOPATH XCEL 5M (ENDOMECHANICALS) ×2 IMPLANT
SPECIMEN JAR SMALL (MISCELLANEOUS) ×2 IMPLANT
SUT MON AB 4-0 PC3 18 (SUTURE) ×2 IMPLANT
TOWEL OR 17X24 6PK STRL BLUE (TOWEL DISPOSABLE) ×2 IMPLANT
TOWEL OR 17X26 10 PK STRL BLUE (TOWEL DISPOSABLE) ×2 IMPLANT
TRAY LAPAROSCOPIC MC (CUSTOM PROCEDURE TRAY) ×2 IMPLANT
TROCAR XCEL BLUNT TIP 100MML (ENDOMECHANICALS) ×2 IMPLANT
TROCAR XCEL NON-BLD 5MMX100MML (ENDOMECHANICALS) ×2 IMPLANT
WATER STERILE IRR 1000ML POUR (IV SOLUTION) ×2 IMPLANT

## 2019-03-17 NOTE — ED Notes (Addendum)
ED TO INPATIENT HANDOFF REPORT  ED Nurse Name and Phone #: Annie Main [phone]  S Name/Age/Gender Alyssa Oliver 43 y.o. female Room/Bed: 016C/016C  Code Status   Code Status: Full Code  Home/SNF/Other Home Patient oriented to: self, place, time and situation Is this baseline? Yes   Triage Complete: Triage complete  Chief Complaint ABD Pain   Triage Note Pt sent to ED from u/c.  Onset 12 noon today upper abd pain, nausea.  No vomiting.  Pt ate salmon yesterday and not sure if related.  No cough/sore throat/fever.  Pt was given GI cocktail at u/c with mild relief.    Allergies Allergies  Allergen Reactions  . Penicillins Itching    Level of Care/Admitting Diagnosis ED Disposition    ED Disposition Condition [facility]: [place] [[id number]]  Level of Care: Med-Surg [16]  Covid Evaluation: Screening Protocol (No Symptoms)  Diagnosis: Acute appendicitis [242683]  Admitting Physician: CCS, [place]  Attending Physician: CCS, MD [[id number]]  Bed request comments: 6n  PT Class (Do Not Modify): Observation [104]  PT Acc Code (Do Not Modify): Observation [10022]       B Medical/Surgery History Past Medical History:  Diagnosis Date  . Diabetes mellitus without complication ([place])   . Thyroid disease    History reviewed. No pertinent surgical history.   A IV Location/Drains/Wounds Patient Lines/Drains/Airways Status   Active Line/Drains/Airways    Name:   Placement date:   Placement time:   Site:   Days:   Peripheral IV 03/16/19 Left Antecubital   03/16/19    2202    Antecubital   1   Peripheral IV 03/17/19 Right Antecubital   03/17/19    0058    Antecubital   less than 1          Intake/Output Last 24 hours No intake or output data in the 24 hours ending 03/17/19 0216  Labs/Imaging Results for orders placed or performed during the hospital encounter of 03/16/19 (from the past 48 hour(s))  Lipase, blood     Status: None   Collection Time: 03/16/19  8:30 PM  Result Value Ref Range   Lipase 23 11 - 51 U/L    Comment: Performed at [facility], [place] [address] [place], [place] [address]  Comprehensive metabolic panel     Status: Abnormal   Collection Time: 03/16/19  8:30 PM  Result Value Ref Range   Sodium 135 135 - 145 mmol/L   Potassium 3.4 (L) 3.5 - 5.1 mmol/L   Chloride 103 98 - 111 mmol/L   CO2 24 22 - 32 mmol/L   Glucose, Bld 120 (H) 70 - 99 mg/dL   BUN 7 6 - 20 mg/dL   Creatinine, Ser 0.57 0.44 - 1.00 mg/dL   Calcium 9.1 8.9 - 10.3 mg/dL   Total Protein 6.9 6.5 - 8.1 g/dL   Albumin 3.9 3.5 - 5.0 g/dL   AST 17 15 - 41 U/L   ALT 14 0 - 44 U/L   Alkaline Phosphatase 54 38 - 126 U/L   Total Bilirubin 0.2 (L) 0.3 - 1.2 mg/dL   GFR calc non Af Amer >60 >60 mL/min   GFR calc Af Amer >60 >60 mL/min   Anion gap 8 5 - 15    Comment: Performed at [facility] [address] [place], [place] [address]  CBC     Status: Abnormal   Collection Time: 03/16/19  8:30  PM  Result Value Ref Range   WBC 10.2 4.0 - 10.5 K/uL   RBC 3.64 (L) 3.87 - 5.11 MIL/uL   Hemoglobin 9.9 (L) 12.0 - 15.0 g/dL   HCT 31.6 (L) 36.0 - 46.0 %   MCV 86.8 80.0 - 100.0 fL   MCH 27.2 26.0 - 34.0 pg   MCHC 31.3 30.0 - 36.0 g/dL   RDW 18.9 (H) 11.5 - 15.5 %   Platelets 434 (H) 150 - 400 K/uL   nRBC 0.0 0.0 - 0.2 %    Comment: Performed at [facility] [address] [place], [place] [address]  Urinalysis, Routine w reflex microscopic     Status: Abnormal   Collection Time: 03/16/19  8:30 PM  Result Value Ref Range   Color, Urine YELLOW YELLOW   APPearance CLEAR CLEAR   Specific Gravity, Urine 1.019 1.005 - 1.030   pH 8.0 5.0 - 8.0   Glucose, UA NEGATIVE NEGATIVE mg/dL   Hgb urine dipstick NEGATIVE NEGATIVE   Bilirubin Urine NEGATIVE NEGATIVE   Ketones, ur 5 (A) NEGATIVE mg/dL   Protein, ur NEGATIVE NEGATIVE mg/dL   Nitrite NEGATIVE NEGATIVE   Leukocytes,Ua NEGATIVE NEGATIVE    Comment: Performed at [facility] [address] [place], [place] [address]  I-Stat beta hCG blood, ED     Status: None   Collection Time: 03/16/19  8:55 PM  Result Value Ref Range   I-stat hCG, quantitative <5.0 <5 mIU/mL   Comment 3            Comment:   GEST. AGE      CONC.  (mIU/mL)   <=1 WEEK        5 - 50     2 WEEKS       50 - 500     3 WEEKS       100 - 10,000     4 WEEKS     1,000 - 30,000        FEMALE AND NON-PREGNANT FEMALE:     LESS THAN 5 mIU/mL    Ct Abdomen Pelvis W Contrast  Result Date: 03/17/2019 CLINICAL DATA:  Upper abdominal pain, nausea EXAM: CT ABDOMEN AND PELVIS WITH CONTRAST TECHNIQUE: Multidetector CT imaging of the abdomen and pelvis was performed using the standard protocol following bolus administration of intravenous contrast. CONTRAST:  194mL OMNIPAQUE IOHEXOL 300 MG/ML  SOLN COMPARISON:  Ultrasound 03/16/2019 FINDINGS: Lower chest: Lung bases are clear. No effusions. Heart is normal size. Hepatobiliary: Early fatty infiltration of the liver diffusely. No focal hepatic abnormality. Gallbladder unremarkable. Pancreas: No focal abnormality or ductal dilatation. Spleen: No focal abnormality.  Normal size. Adrenals/Urinary Tract: No adrenal abnormality. No focal renal abnormality. No stones or hydronephrosis. Urinary bladder is unremarkable. Stomach/Bowel: Appendix is dilated with mucosal enhancement and surrounding inflammation compatible with acute appendicitis. 11 mm in diameter. Stomach, large and small bowel grossly unremarkable. Vascular/Lymphatic: No evidence of aneurysm or adenopathy. Reproductive: Uterus and adnexa unremarkable.  No mass. Other: No free fluid or free air. Musculoskeletal: No acute bony abnormality. IMPRESSION: Dilated, inflamed appendix compatible with acute appendicitis. Early fatty infiltration of the liver. Electronically Signed   By: Rolm Baptise M.D.   On: 03/17/2019 00:21   US Abdomen Limited Ruq  Result Date: 03/16/2019 CLINICAL DATA:  Right upper  quadrant abdominal pain. EXAM: ULTRASOUND ABDOMEN LIMITED RIGHT UPPER QUADRANT COMPARISON:  None. FINDINGS: Gallbladder: Physiologically distended. No gallstones or wall thickening visualized. No sonographic Percell Miller  sign noted by sonographer. Common bile duct: Diameter: 3 mm, normal. Liver: No focal lesion identified. Slight increase in parenchymal echogenicity compared to right kidney. Portal vein is patent on color Doppler imaging with normal direction of blood flow towards the liver. IMPRESSION: 1. Negative gallbladder.  No biliary dilatation. 2. Borderline mild hepatic steatosis. Electronically Signed   By: Keith Rake M.D.   On: 03/16/2019 23:01    Pending Labs Unresulted Labs (From admission, onward)    Start     Ordered   03/17/19 3875  Basic metabolic panel  Tomorrow morning,   R     03/17/19 0202   03/17/19 0500  CBC  Tomorrow morning,   R     03/17/19 0202   03/17/19 0159  HIV antibody (Routine Testing)  Once,   STAT     03/17/19 0202   03/17/19 0116  SARS Coronavirus 2  Once,   R     03/17/19 0116          Vitals/Pain Today's Vitals   03/16/19 2331 03/16/19 2332 03/17/19 0145 03/17/19 0216  BP:   (!) 158/87   Pulse:      Resp:      Temp:      TempSrc:      SpO2:      PainSc: 5  5   5      Isolation Precautions No active isolations  Medications Medications  sodium chloride flush (NS) 0.9 % injection 3 mL (has no administration in time range)  enoxaparin (LOVENOX) injection 40 mg (has no administration in time range)  0.9 %  sodium chloride infusion (has no administration in time range)  cefTRIAXone (ROCEPHIN) 2 g in sodium chloride 0.9 % 100 mL IVPB (has no administration in time range)    And  metroNIDAZOLE (FLAGYL) IVPB 500 mg (has no administration in time range)  acetaminophen (TYLENOL) tablet 1,000 mg (has no administration in time range)  ketorolac (TORADOL) 30 MG/ML injection 30 mg (has no administration in time range)  traMADol (ULTRAM) tablet 50 mg  (has no administration in time range)  oxyCODONE (Oxy IR/ROXICODONE) immediate release tablet 5 mg (has no administration in time range)  HYDROmorphone (DILAUDID) injection 0.5 mg (has no administration in time range)  methocarbamol (ROBAXIN) tablet 500 mg (has no administration in time range)  diphenhydrAMINE (BENADRYL) capsule 25 mg (has no administration in time range)    Or  diphenhydrAMINE (BENADRYL) injection 25 mg (has no administration in time range)  docusate sodium (COLACE) capsule 100 mg (has no administration in time range)  bisacodyl (DULCOLAX) suppository 10 mg (has no administration in time range)  ondansetron (ZOFRAN-ODT) disintegrating tablet 4 mg (has no administration in time range)    Or  ondansetron (ZOFRAN) injection 4 mg (has no administration in time range)  metoprolol tartrate (LOPRESSOR) injection 5 mg (has no administration in time range)  hydrALAZINE (APRESOLINE) injection 10 mg (has no administration in time range)  levothyroxine (SYNTHROID) tablet 50 mcg (has no administration in time range)  insulin aspart (novoLOG) injection 0-15 Units (has no administration in time range)  morphine 2 MG/ML injection 2 mg (2 mg Intravenous Given 03/16/19 2205)  iohexol (OMNIPAQUE) 300 MG/ML solution 125 mL (125 mLs Intravenous Contrast Given 03/17/19 0006)  cefTRIAXone (ROCEPHIN) 2 g in sodium chloride 0.9 % 100 mL IVPB (0 g Intravenous Stopped 03/17/19 0203)    And  metroNIDAZOLE (FLAGYL) IVPB 500 mg (0 mg Intravenous Stopped 03/17/19 0204)  sodium chloride 0.9 % bolus 1,000  mL (1,000 mLs Intravenous New Bag/Given 03/17/19 0046)  morphine 4 MG/ML injection 4 mg (4 mg Intravenous Given 03/17/19 0106)    Mobility walks Low fall risk   Focused Assessments Appendicitis    R Recommendations: See Admitting Provider Note  Report given to:   Additional Notes:   Very nice 43 year old woman with acute appendicitis.  I had a pretty long discussion with her about the options of  laparoscopic appendectomy which I do recommend, versus treatment with antibiotics alone with concomitant high risk of treatment failure and/ or recurrent disease in the future.  She is very nervous about being in the hospital with no family to help her and be around her and we discussed that; she asked if she could leave and come back to have surgery on Friday or at another time and I do strongly recommend against that and counseled her as to why.  Discussed that no visitors are being allowed in the hospital at this time.  At the conclusion of her conversation, she is agreeable to being admitted for laparoscopic appendectomy.  We discussed the surgery including risks of bleeding, infection, pain, scarring, injury to intra-abdominal structures, conversion to open surgery or more extensive resection, risk of staple line leak or delayed abscess, failure to resolve symptoms, postoperative ileus, incisional hernia, as well as general risks of DVT/PE, pneumonia, stroke, heart attack, death. Questions were welcomed and answered to the patient's satisfaction.  We will plan for surgery later this morning with Dr. Ninfa Linden.

## 2019-03-17 NOTE — ED Provider Notes (Signed)
[facility] EMERGENCY DEPARTMENT Provider Note   CSN: [id number] Arrival date & time: 03/16/19  1950    History   Chief Complaint Chief Complaint  Patient presents with  . Abdominal Pain    HPI Alyssa Oliver is a 43 y.o. female presenting today for upper abdominal pain and nausea that began around noon.  Patient reports that she was at work with sudden onset of pain, tight burning sensation constant worsened with palpation, nonradiating.  Patient denies eating or drinking prior to onset of her symptoms.  Patient was seen at urgent care just prior to arrival given GI cocktail with some improvement of pain but patient feels that pain is returning on my examination.  She denies similar symptoms in the past.  Patient endorses some nausea with her pain without vomiting.  Denies fever/chills, cough/shortness of breath, chest pain, vomiting, diarrhea, injury or any additional concerns.     HPI  Past Medical History:  Diagnosis Date  . Diabetes mellitus without complication ([place])   . Thyroid disease     There are no active problems to display for this patient.   History reviewed. No pertinent surgical history.   OB History   No obstetric history on file.      Home Medications    Prior to Admission medications   Medication Sig Start Date End Date Taking? Authorizing Provider  levothyroxine (SYNTHROID, LEVOTHROID) 50 MCG tablet Take 50 mcg by mouth daily before breakfast.    [provider]  metFORMIN (GLUCOPHAGE) 500 MG tablet Take by mouth 2 (two) times daily with a meal.    [provider]    Family History History reviewed. No pertinent family history.  Social History Social History   Tobacco Use  . Smoking status: Never Smoker  . Smokeless tobacco: Never Used  Substance Use Topics  . Alcohol use: Not Currently  . Drug use: Not on file     Allergies   Patient has no known allergies.   Review of Systems Review of Systems   Constitutional: Negative for chills and fever.  Respiratory: Negative.  Negative for cough and shortness of breath.   Cardiovascular: Negative.  Negative for chest pain.  Gastrointestinal: Positive for abdominal pain and nausea. Negative for blood in stool, diarrhea and vomiting.  Genitourinary: Negative for dysuria, hematuria, menstrual problem, vaginal bleeding and vaginal discharge.  Musculoskeletal: Negative.  Negative for back pain and neck pain.  All other systems reviewed and are negative.  Physical Exam Updated Vital Signs BP 129/79   Pulse 68   Temp 98.2 F (36.8 C) (Oral)   Resp 18   LMP 02/24/2019   SpO2 100%   Physical Exam Constitutional:      General: She is not in acute distress.    Appearance: Normal appearance. She is well-developed. She is not ill-appearing or diaphoretic.  HENT:     Head: Normocephalic and atraumatic.     Right Ear: External ear normal.     Left Ear: External ear normal.     Nose: Nose normal.  Eyes:     General: Vision grossly intact. Gaze aligned appropriately.     Pupils: Pupils are equal, round, and reactive to light.  Neck:     Musculoskeletal: Normal range of motion.     Trachea: Trachea and phonation normal. No tracheal deviation.  Cardiovascular:     Rate and Rhythm: Normal rate and regular rhythm.     Pulses:  Dorsalis pedis pulses are 2+ on the right side and 2+ on the left side.     Heart sounds: Normal heart sounds.  Pulmonary:     Effort: Pulmonary effort is normal. No respiratory distress.     Breath sounds: Normal breath sounds. No rhonchi.  Abdominal:     General: Bowel sounds are normal. There is no distension.     Palpations: Abdomen is soft.     Tenderness: There is abdominal tenderness in the right upper quadrant and epigastric area. There is no guarding or rebound. Positive signs include Murphy's sign. Negative signs include Rovsing's sign and McBurney's sign.  Genitourinary:    Comments: Examination  deferred by patient Musculoskeletal: Normal range of motion.  Feet:     Right foot:     Protective Sensation: 3 sites tested. 3 sites sensed.     Left foot:     Protective Sensation: 3 sites tested. 3 sites sensed.  Skin:    General: Skin is warm and dry.  Neurological:     Mental Status: She is alert.     GCS: GCS eye subscore is 4. GCS verbal subscore is 5. GCS motor subscore is 6.     Comments: Speech is clear and goal oriented, follows commands Major Cranial nerves without deficit, no facial droop Moves extremities without ataxia, coordination intact  Psychiatric:        Behavior: Behavior normal.     ED Treatments / Results  Labs (all labs ordered are listed, but only abnormal results are displayed) Labs Reviewed  COMPREHENSIVE METABOLIC PANEL - Abnormal; Notable for the following components:      Result Value   Potassium 3.4 (*)    Glucose, Bld 120 (*)    Total Bilirubin 0.2 (*)    All other components within normal limits  CBC - Abnormal; Notable for the following components:   RBC 3.64 (*)    Hemoglobin 9.9 (*)    HCT 31.6 (*)    RDW 18.9 (*)    Platelets 434 (*)    All other components within normal limits  URINALYSIS, ROUTINE W REFLEX MICROSCOPIC - Abnormal; Notable for the following components:   Ketones, ur 5 (*)    All other components within normal limits  SARS CORONAVIRUS 2 (HOSPITAL ORDER, [facility])  LIPASE, BLOOD  I-STAT BETA HCG BLOOD, ED ([facility], [facility], [facility] ONLY)    EKG None  Radiology Ct Abdomen Pelvis W Contrast  Result Date: 03/17/2019 CLINICAL DATA:  Upper abdominal pain, nausea EXAM: CT ABDOMEN AND PELVIS WITH CONTRAST TECHNIQUE: Multidetector CT imaging of the abdomen and pelvis was performed using the standard protocol following bolus administration of intravenous contrast. CONTRAST:  137mL OMNIPAQUE IOHEXOL 300 MG/ML  SOLN COMPARISON:  Ultrasound 03/16/2019 FINDINGS: Lower chest: Lung bases are clear. No effusions.  Heart is normal size. Hepatobiliary: Early fatty infiltration of the liver diffusely. No focal hepatic abnormality. Gallbladder unremarkable. Pancreas: No focal abnormality or ductal dilatation. Spleen: No focal abnormality.  Normal size. Adrenals/Urinary Tract: No adrenal abnormality. No focal renal abnormality. No stones or hydronephrosis. Urinary bladder is unremarkable. Stomach/Bowel: Appendix is dilated with mucosal enhancement and surrounding inflammation compatible with acute appendicitis. 11 mm in diameter. Stomach, large and small bowel grossly unremarkable. Vascular/Lymphatic: No evidence of aneurysm or adenopathy. Reproductive: Uterus and adnexa unremarkable.  No mass. Other: No free fluid or free air. Musculoskeletal: No acute bony abnormality. IMPRESSION: Dilated, inflamed appendix compatible with acute appendicitis. Early fatty infiltration of the  liver. Electronically Signed   By: Rolm Baptise M.D.   On: 03/17/2019 00:21   US Abdomen Limited Ruq  Result Date: 03/16/2019 CLINICAL DATA:  Right upper quadrant abdominal pain. EXAM: ULTRASOUND ABDOMEN LIMITED RIGHT UPPER QUADRANT COMPARISON:  None. FINDINGS: Gallbladder: Physiologically distended. No gallstones or wall thickening visualized. No sonographic Murphy sign noted by sonographer. Common bile duct: Diameter: 3 mm, normal. Liver: No focal lesion identified. Slight increase in parenchymal echogenicity compared to right kidney. Portal vein is patent on color Doppler imaging with normal direction of blood flow towards the liver. IMPRESSION: 1. Negative gallbladder.  No biliary dilatation. 2. Borderline mild hepatic steatosis. Electronically Signed   By: Keith Rake M.D.   On: 03/16/2019 23:01    Procedures Procedures (including critical care time)  Medications Ordered in ED Medications  sodium chloride flush (NS) 0.9 % injection 3 mL (has no administration in time range)  cefTRIAXone (ROCEPHIN) 2 g in sodium chloride 0.9 % 100 mL  IVPB (has no administration in time range)    And  metroNIDAZOLE (FLAGYL) IVPB 500 mg (has no administration in time range)  sodium chloride 0.9 % bolus 1,000 mL (has no administration in time range)  morphine 2 MG/ML injection 2 mg (2 mg Intravenous Given 03/16/19 2205)  iohexol (OMNIPAQUE) 300 MG/ML solution 125 mL (125 mLs Intravenous Contrast Given 03/17/19 0006)     Initial Impression / Assessment and Plan / ED Course  I have reviewed the triage vital signs and the nursing notes.  Pertinent labs & imaging results that were available during my care of the patient were reviewed by me and considered in my medical decision making (see chart for details).    CBC nonacute Lipase within normal limits Beta-hCG negative CMP nonacute Urinalysis with 5 ketones [place] Abd RUQ:  IMPRESSION:  1. Negative gallbladder. No biliary dilatation.  2. Borderline mild hepatic steatosis.   Persistent symptoms despite morphine will order CT abdomen pelvis. - CT Abd/Pelvis: IMPRESSION: Dilated, inflamed appendix compatible with acute appendicitis. Early fatty infiltration of the liver.  - IV Flagyl, Rocephin ordered, fluid bolus ordered, additional pain control ordered.  Consult called to general surgery for evaluation.  Screening COVID test ordered. - Patient updated on CT scan findings and states understanding and need for surgical consultation.  Reassessment of the abdomen reveals some diffuse upper abdominal tenderness there is no lower abdominal tenderness and a negative McBurney's point sign.  Vital signs have remained stable she is overall well-appearing yet tired and in no acute distress.  Does not meet SIRS or sepsis criteria. - Discussed case with general surgery who are aware of patient and will see in ED for consultation. - 1:05 AM: Patient awaiting surgical consult, reassessed resting comfortably and in no acute distress.  Vital signs stable.  Patient has IV antibiotics and fluids running.   Care handoff given to Dr. Wyvonnia Dusky at shift change for monitoring until surgical evaluation.  Note: Portions of this report may have been transcribed using voice recognition software. Every effort was made to ensure accuracy; however, inadvertent computerized transcription errors may still be present. Final Clinical Impressions(s) / ED Diagnoses   Final diagnoses:  RUQ abdominal pain  Acute appendicitis, unspecified acute appendicitis type    ED Discharge Orders    None       Gari Crown 03/17/19 0112    Dorie Rank, MD 03/19/19 1038

## 2019-03-17 NOTE — Anesthesia Preprocedure Evaluation (Addendum)
Anesthesia Evaluation  Patient identified by MRN, date of birth, ID band Patient awake    Reviewed: Allergy & Precautions, NPO status , Patient's Chart, lab work & pertinent test results  History of Anesthesia Complications Negative for: history of anesthetic complications  Airway Mallampati: II  TM Distance: >3 FB Neck ROM: Full    Dental  (+) Dental Advisory Given   Pulmonary neg pulmonary ROS,    breath sounds clear to auscultation       Cardiovascular negative cardio ROS   Rhythm:Regular Rate:Normal     Neuro/Psych negative neurological ROS  negative psych ROS   GI/Hepatic negative GI ROS, Neg liver ROS,   Endo/Other  diabetes, Type 2, Oral Hypoglycemic AgentsHypothyroidism  Obesity   Renal/GU negative Renal ROS     Musculoskeletal negative musculoskeletal ROS (+)   Abdominal (+) + obese,   Peds  Hematology negative hematology ROS (+) anemia ,   Anesthesia Other Findings   Reproductive/Obstetrics                           Anesthesia Physical Anesthesia Plan  ASA: II  Anesthesia Plan: General   Post-op Pain Management:    Induction: Intravenous  PONV Risk Score and Plan: 4 or greater and Treatment may vary due to age or medical condition, Ondansetron, Scopolamine patch - Pre-op, Midazolam and Dexamethasone  Airway Management Planned: Oral ETT  Additional Equipment: None  Intra-op Plan:   Post-operative Plan: Extubation in OR  Informed Consent: I have reviewed the patients History and Physical, chart, labs and discussed the procedure including the risks, benefits and alternatives for the proposed anesthesia with the patient or authorized representative who has indicated his/her understanding and acceptance.     Dental advisory given  Plan Discussed with: CRNA and Anesthesiologist  Anesthesia Plan Comments:        Anesthesia Quick Evaluation

## 2019-03-17 NOTE — Discharge Instructions (Signed)
[facility] LAPAROSCOPIC SURGERY: POST OP INSTRUCTIONS Always review your discharge instruction sheet given to you by the facility where your surgery was performed. IF YOU HAVE DISABILITY OR FAMILY LEAVE FORMS, YOU MUST BRING THEM TO THE OFFICE FOR PROCESSING.   DO NOT GIVE THEM TO YOUR DOCTOR.  PAIN CONTROL  1. First take acetaminophen (Tylenol) AND/or ibuprofen (Advil) to control your pain after surgery.  Follow directions on package.  Taking acetaminophen (Tylenol) and/or ibuprofen (Advil) regularly after surgery will help to control your pain and lower the amount of prescription pain medication you may need.  You should not take more than 3,000 mg (3 grams) of acetaminophen (Tylenol) in 24 hours.  You should not take ibuprofen (Advil), aleve, motrin, naprosyn or other NSAIDS if you have a history of stomach ulcers or chronic kidney disease.  2. A prescription for pain medication may be given to you upon discharge.  Take your pain medication as prescribed, if you still have uncontrolled pain after taking acetaminophen (Tylenol) or ibuprofen (Advil). 3. Use ice packs to help control pain. 4. If you need a refill on your pain medication, please contact your pharmacy.  They will contact our office to request authorization. Prescriptions will not be filled after 5pm or on week-ends.  HOME MEDICATIONS 5. Take your usually prescribed medications unless otherwise directed.  DIET 6. You should follow a light diet the first few days after arrival home.  Be sure to include lots of fluids daily. Avoid fatty, fried foods.   CONSTIPATION 7. It is common to experience some constipation after surgery and if you are taking pain medication.  Increasing fluid intake and taking a stool softener (such as Colace) will usually help or prevent this problem from occurring.  A mild laxative (Milk of Magnesia or Miralax) should be taken according to package instructions if there are no bowel  movements after 48 hours.  WOUND/INCISION CARE 8. Most patients will experience some swelling and bruising in the area of the incisions.  Ice packs will help.  Swelling and bruising can take several days to resolve.  9. Unless discharge instructions indicate otherwise, follow guidelines below  a. STERI-STRIPS - you may remove your outer bandages 48 hours after surgery, and you may shower at that time.  You have steri-strips (small skin tapes) in place directly over the incision.  These strips should be left on the skin for 7-10 days.   b. DERMABOND/SKIN GLUE - you may shower in 24 hours.  The glue will flake off over the next 2-3 weeks. 10. Any sutures or staples will be removed at the office during your follow-up visit.  ACTIVITIES 11. You may resume regular (light) daily activities beginning the next day--such as daily self-care, walking, climbing stairs--gradually increasing activities as tolerated.  You may have sexual intercourse when it is comfortable.  Refrain from any heavy lifting or straining until approved by your doctor. a. You may drive when you are no longer taking prescription pain medication, you can comfortably wear a seatbelt, and you can safely maneuver your car and apply brakes.  FOLLOW-UP 12. You should see your doctor in the office for a follow-up appointment approximately 2-3 weeks after your surgery.  You should have been given your post-op/follow-up appointment when your surgery was scheduled.  If you did not receive a post-op/follow-up appointment, make sure that you call for this appointment within a day or two after you arrive home to insure a convenient appointment time.     WHEN TO CALL YOUR DOCTOR: 1. Fever over 101.0 2. Inability to urinate 3. Continued bleeding from incision. 4. Increased pain, redness, or drainage from the incision. 5. Increasing abdominal pain  The clinic staff is available to answer your questions during regular business hours.  Please don't  hesitate to call and ask to speak to one of the nurses for clinical concerns.  If you have a medical emergency, go to the nearest emergency room or call 911.  A surgeon from [facility] is always on call at the hospital. [address], [address], [place], [place]  [address] ? [address] [address], [place], [place]   [address] [phone] ? [phone] ? FAX [phone] Web site: [email or web address]  .........   Managing Your Pain After Surgery Without Opioids    Thank you for participating in our program to help patients manage their pain after surgery without opioids. This is part of our effort to provide you with the best care possible, without exposing you or your family to the risk that opioids pose.  What pain can I expect after surgery? You can expect to have some pain after surgery. This is normal. The pain is typically worse the day after surgery, and quickly begins to get better. Many studies have found that many patients are able to manage their pain after surgery with Over-the-Counter (OTC) medications such as Tylenol and Motrin. If you have a condition that does not allow you to take Tylenol or Motrin, notify your surgical team.  How will I manage my pain? The best strategy for controlling your pain after surgery is around the clock pain control with Tylenol (acetaminophen) and Motrin (ibuprofen or Advil). Alternating these medications with each other allows you to maximize your pain control. In addition to Tylenol and Motrin, you can use heating pads or ice packs on your incisions to help reduce your pain.  How will I alternate your regular strength over-the-counter pain medication? You will take a dose of pain medication every three hours. ; Start by taking 650 mg of Tylenol (2 pills of 325 mg) ; 3 hours later take 600 mg of Motrin (3 pills of 200 mg) ; 3 hours after taking the Motrin take 650 mg of Tylenol ; 3 hours after that take 600 mg of  Motrin.   - 1 -  See example - if your first dose of Tylenol is at 12:00 PM   12:00 PM Tylenol 650 mg (2 pills of 325 mg)  3:00 PM Motrin 600 mg (3 pills of 200 mg)  6:00 PM Tylenol 650 mg (2 pills of 325 mg)  9:00 PM Motrin 600 mg (3 pills of 200 mg)  Continue alternating every 3 hours   We recommend that you follow this schedule around-the-clock for at least 3 days after surgery, or until you feel that it is no longer needed. Use the table on the last page of this handout to keep track of the medications you are taking. Important: Do not take more than 3000mg of Tylenol or 3200mg of Motrin in a 24-hour period. Do not take ibuprofen/Motrin if you have a history of bleeding stomach ulcers, severe kidney disease, &/or actively taking a blood thinner  What if I still have pain? If you have pain that is not controlled with the over-the-counter pain medications (Tylenol and Motrin or Advil) you might have what we call "breakthrough" pain. You will receive a prescription for a small amount of an opioid pain medication such as   Oxycodone, Tramadol, or Tylenol with Codeine. Use these opioid pills in the first 24 hours after surgery if you have breakthrough pain. Do not take more than 1 pill every 4-6 hours.  If you still have uncontrolled pain after using all opioid pills, don't hesitate to call our staff using the number provided. We will help make sure you are managing your pain in the best way possible, and if necessary, we can provide a prescription for additional pain medication.   Day 1    Time  Name of Medication Number of pills taken  Amount of Acetaminophen  Pain Level   Comments  AM PM       AM PM       AM PM       AM PM       AM PM       AM PM       AM PM       AM PM       Total Daily amount of Acetaminophen Do not take more than  3,000 mg per day      Day 2    Time  Name of Medication Number of pills taken  Amount of Acetaminophen  Pain Level   Comments  AM  PM       AM PM       AM PM       AM PM       AM PM       AM PM       AM PM       AM PM       Total Daily amount of Acetaminophen Do not take more than  3,000 mg per day      Day 3    Time  Name of Medication Number of pills taken  Amount of Acetaminophen  Pain Level   Comments  AM PM       AM PM       AM PM       AM PM          AM PM       AM PM       AM PM       AM PM       Total Daily amount of Acetaminophen Do not take more than  3,000 mg per day      Day 4    Time  Name of Medication Number of pills taken  Amount of Acetaminophen  Pain Level   Comments  AM PM       AM PM       AM PM       AM PM       AM PM       AM PM       AM PM       AM PM       Total Daily amount of Acetaminophen Do not take more than  3,000 mg per day      Day 5    Time  Name of Medication Number of pills taken  Amount of Acetaminophen  Pain Level   Comments  AM PM       AM PM       AM PM       AM PM       AM PM       AM PM       AM PM         AM PM       Total Daily amount of Acetaminophen Do not take more than  3,000 mg per day       Day 6    Time  Name of Medication Number of pills taken  Amount of Acetaminophen  Pain Level  Comments  AM PM       AM PM       AM PM       AM PM       AM PM       AM PM       AM PM       AM PM       Total Daily amount of Acetaminophen Do not take more than  3,000 mg per day      Day 7    Time  Name of Medication Number of pills taken  Amount of Acetaminophen  Pain Level   Comments  AM PM       AM PM       AM PM       AM PM       AM PM       AM PM       AM PM       AM PM       Total Daily amount of Acetaminophen Do not take more than  3,000 mg per day        For additional information about how and where to safely dispose of unused opioid medications - https://www.morepowerfulnc.org  Disclaimer: This document contains information and/or instructional materials adapted from [facility]  for the typical patient with your condition. It does not replace medical advice from your health care provider because your experience may differ from that of the typical patient. Talk to your health care provider if you have any questions about this document, your condition or your treatment plan. Adapted from [facility]  

## 2019-03-17 NOTE — Transfer of Care (Signed)
Immediate Anesthesia Transfer of Care Note  Patient: Alyssa Oliver  Procedure(s) Performed: APPENDECTOMY LAPAROSCOPIC (N/A )  Patient Location: PACU  Anesthesia Type:General  Level of Consciousness: awake, patient cooperative and responds to stimulation  Airway & Oxygen Therapy: Patient Spontanous Breathing and Patient connected to face mask oxygen  Post-op Assessment: Report given to RN, Post -op Vital signs reviewed and stable and Patient moving all extremities X 4  Post vital signs: Reviewed and stable  Last Vitals:  Vitals Value Taken Time  BP    Temp    Pulse 72 03/17/19 1113  Resp 15 03/17/19 1113  SpO2 100 % 03/17/19 1113  Vitals shown include unvalidated device data.  Last Pain:  Vitals:   03/17/19 0751  TempSrc:   PainSc: 0-No pain         Complications: No apparent anesthesia complications

## 2019-03-17 NOTE — Anesthesia Procedure Notes (Signed)
Procedure Name: Intubation Date/Time: 03/17/2019 10:12 AM Performed by: Glynda Jaeger, CRNA Pre-anesthesia Checklist: Patient identified, Patient being monitored, Timeout performed, Emergency Drugs available and Suction available Patient Re-evaluated:Patient Re-evaluated prior to induction Oxygen Delivery Method: Circle System Utilized Preoxygenation: Pre-oxygenation with 100% oxygen Induction Type: IV induction Laryngoscope Size: Mac and 4 Grade View: Grade I Tube type: Oral Tube size: 7.5 mm Number of attempts: 1 Airway Equipment and Method: Stylet Placement Confirmation: ETT inserted through vocal cords under direct vision,  positive ETCO2 and breath sounds checked- equal and bilateral Secured at: 21 cm Tube secured with: Tape Dental Injury: Teeth and Oropharynx as per pre-operative assessment  Comments: RSI

## 2019-03-17 NOTE — H&P (Addendum)
Surgical H&P  CC: abdominal pain  HPI: This is a 43 year old woman who presented to the emergency room with vague upper abdominal pain and nausea that began around midday.  The onset of pain was sudden, described as a tight burning sensation in a band across her entire upper and mid abdomen.  She induced vomiting with minimal relief of her symptoms.  She was given a GI cocktail at the urgent care with minimal improvement.  She has never had prior similar symptoms.  No known fevers or chills, denies any diarrhea or constipation.  She was initially worked up with a right upper quadrant ultrasound which is negative for gallbladder pathology.  Subsequently a CT scan has been performed showing acute appendicitis as detailed below.  Prior abdominal surgery is a C-section 8 years ago.  She is a Occupational psychologist at [employer].  She is originally from [place], moved here 9 years ago.  COVID rapid assay is in process.  Allergies  Allergen Reactions  . Penicillins Itching    Past Medical History:  Diagnosis Date  . Diabetes mellitus without complication ([address])   . Thyroid disease     History reviewed. No pertinent surgical history.  History reviewed. No pertinent family history.  Social History   Socioeconomic History  . Marital status: Married    Spouse name: Not on file  . Number of children: Not on file  . Years of education: Not on file  . Highest education level: Not on file  Occupational History  . Not on file  Social Needs  . Financial resource strain: Not on file  . Food insecurity    Worry: Not on file    Inability: Not on file  . Transportation needs    Medical: Not on file    Non-medical: Not on file  Tobacco Use  . Smoking status: Never Smoker  . Smokeless tobacco: Never Used  Substance and Sexual Activity  . Alcohol use: Not Currently  . Drug use: Not on file  . Sexual activity: Not Currently  Lifestyle  . Physical activity    Days per week: Not on file    Minutes per  session: Not on file  . Stress: Not on file  Relationships  . Social Herbalist on phone: Not on file    Gets together: Not on file    Attends religious service: Not on file    Active member of club or organization: Not on file    Attends meetings of clubs or organizations: Not on file    Relationship status: Not on file  Other Topics Concern  . Not on file  Social History Narrative  . Not on file    No current facility-administered medications on file prior to encounter.    Current Outpatient Medications on File Prior to Encounter  Medication Sig Dispense Refill  . ferrous sulfate 325 (65 FE) MG tablet Take 325 mg by mouth daily with breakfast.    . levothyroxine (SYNTHROID, LEVOTHROID) 50 MCG tablet Take 50 mcg by mouth daily before breakfast.    . metFORMIN (GLUCOPHAGE) 500 MG tablet Take 500 mg by mouth 2 (two) times daily with a meal.       Review of Systems: a complete, 10pt review of systems was completed with pertinent positives and negatives as documented in the HPI  Physical Exam: Vitals:   03/16/19 2245 03/17/19 0145  BP: 129/79 (!) 158/87  Pulse: 68   Resp:    Temp:  SpO2: 100%    Gen: A&Ox3, no distress  Head: normocephalic, atraumatic Eyes: extraocular motions intact, anicteric.  Neck: supple without mass or thyromegaly Chest: unlabored respirations, symmetrical air entry, clear bilaterally   Cardiovascular: RRR with palpable distal pulses, no pedal edema Abdomen: soft, nondistended, tender across right mid and upper abdomen most notable at the level of the umbilicus. No mass or organomegaly.  Extremities: warm, without edema, no deformities  Neuro: grossly intact Psych: appropriate mood and affect, normal insight  Skin: warm and dry   CBC Latest Ref Rng & Units 03/16/2019  WBC 4.0 - 10.5 K/uL 10.2  Hemoglobin 12.0 - 15.0 g/dL 9.9(L)  Hematocrit 36.0 - 46.0 % 31.6(L)  Platelets 150 - 400 K/uL 434(H)    CMP Latest Ref Rng & Units  03/16/2019  Glucose 70 - 99 mg/dL 120(H)  BUN 6 - 20 mg/dL 7  Creatinine 0.44 - 1.00 mg/dL 0.57  Sodium 135 - 145 mmol/L 135  Potassium 3.5 - 5.1 mmol/L 3.4(L)  Chloride 98 - 111 mmol/L 103  CO2 22 - 32 mmol/L 24  Calcium 8.9 - 10.3 mg/dL 9.1  Total Protein 6.5 - 8.1 g/dL 6.9  Total Bilirubin 0.3 - 1.2 mg/dL 0.2(L)  Alkaline Phos 38 - 126 U/L 54  AST 15 - 41 U/L 17  ALT 0 - 44 U/L 14    No results found for: INR, PROTIME  Imaging: Ct Abdomen Pelvis W Contrast  Result Date: 03/17/2019 CLINICAL DATA:  Upper abdominal pain, nausea EXAM: CT ABDOMEN AND PELVIS WITH CONTRAST TECHNIQUE: Multidetector CT imaging of the abdomen and pelvis was performed using the standard protocol following bolus administration of intravenous contrast. CONTRAST:  190mL OMNIPAQUE IOHEXOL 300 MG/ML  SOLN COMPARISON:  Ultrasound 03/16/2019 FINDINGS: Lower chest: Lung bases are clear. No effusions. Heart is normal size. Hepatobiliary: Early fatty infiltration of the liver diffusely. No focal hepatic abnormality. Gallbladder unremarkable. Pancreas: No focal abnormality or ductal dilatation. Spleen: No focal abnormality.  Normal size. Adrenals/Urinary Tract: No adrenal abnormality. No focal renal abnormality. No stones or hydronephrosis. Urinary bladder is unremarkable. Stomach/Bowel: Appendix is dilated with mucosal enhancement and surrounding inflammation compatible with acute appendicitis. 11 mm in diameter. Stomach, large and small bowel grossly unremarkable. Vascular/Lymphatic: No evidence of aneurysm or adenopathy. Reproductive: Uterus and adnexa unremarkable.  No mass. Other: No free fluid or free air. Musculoskeletal: No acute bony abnormality. IMPRESSION: Dilated, inflamed appendix compatible with acute appendicitis. Early fatty infiltration of the liver. Electronically Signed   By: Rolm Baptise M.D.   On: 03/17/2019 00:21   US Abdomen Limited Ruq  Result Date: 03/16/2019 CLINICAL DATA:  Right upper quadrant  abdominal pain. EXAM: ULTRASOUND ABDOMEN LIMITED RIGHT UPPER QUADRANT COMPARISON:  None. FINDINGS: Gallbladder: Physiologically distended. No gallstones or wall thickening visualized. No sonographic Murphy sign noted by sonographer. Common bile duct: Diameter: 3 mm, normal. Liver: No focal lesion identified. Slight increase in parenchymal echogenicity compared to right kidney. Portal vein is patent on color Doppler imaging with normal direction of blood flow towards the liver. IMPRESSION: 1. Negative gallbladder.  No biliary dilatation. 2. Borderline mild hepatic steatosis. Electronically Signed   By: Keith Rake M.D.   On: 03/16/2019 23:01     A/P: Very nice 43 year old woman with acute appendicitis.  I had a pretty long discussion with her about the options of laparoscopic appendectomy which I do recommend, versus treatment with antibiotics alone with concomitant high risk of treatment failure and/ or recurrent disease in the future.  She  is very nervous about being in the hospital with no family to help her and be around her and we discussed that; she asked if she could leave and come back to have surgery on Friday or at another time and I do strongly recommend against that and counseled her as to why.  Discussed that no visitors are being allowed in the hospital at this time.  At the conclusion of her conversation, she is agreeable to being admitted for laparoscopic appendectomy.  We discussed the surgery including risks of bleeding, infection, pain, scarring, injury to intra-abdominal structures, conversion to open surgery or more extensive resection, risk of staple line leak or delayed abscess, failure to resolve symptoms, postoperative ileus, incisional hernia, as well as general risks of DVT/PE, pneumonia, stroke, heart attack, death. Questions were welcomed and answered to the patient's satisfaction.  We will plan for surgery later this morning with Dr. Ninfa Linden.    Romana Juniper, MD [facility], [place] Pager [phone]

## 2019-03-17 NOTE — Op Note (Signed)
Appendectomy, Lap, Procedure Note  Indications: The patient presented with a history of right-sided abdominal pain. A CT revealed findings consistent with acute appendicitis.  Pre-operative Diagnosis: acute appendicitis  Post-operative Diagnosis: Same  Surgeon: Coralie Keens   Assistants: 0  Anesthesia: General endotracheal anesthesia  ASA Class: 2  Procedure Details  The patient was seen again in the Holding Room. The risks, benefits, complications, treatment options, and expected outcomes were discussed with the patient and/or family. The possibilities of reaction to medication, perforation of viscus, bleeding, recurrent infection, finding a normal appendix, the need for additional procedures, failure to diagnose a condition, and creating a complication requiring transfusion or operation were discussed. There was concurrence with the proposed plan and informed consent was obtained. The site of surgery was properly noted. The patient was taken to Operating Room, identified as Cayli Escajeda and the procedure verified as Appendectomy. A Time Out was held and the above information confirmed.  The patient was placed in the supine position and general anesthesia was induced, along with placement of orogastric tube, Venodyne boots, and a Foley catheter. The abdomen was prepped and draped in a sterile fashion. A one centimeter infraumbilical incision was made.  The midline fascia was incised with a #15 blade.  A Kelly clamp was used to confirm entrance into the peritoneal cavity.  A pursestring suture was passed around the incision with a 0 Vicryl.  The Hasson was introduced into the abdomen and the tails of the suture were used to hold the Hasson in place.   The pneumoperitoneum was then established to steady pressure of 15 mmHg.  Additional 5 mm cannulas then placed in the left lower quadrant of the abdomen and the right upper quadrant under direct visualization. A careful evaluation of the entire  abdomen was carried out. The patient was placed in Trendelenburg and left lateral decubitus position. The small intestines were retracted in the cephalad and left lateral direction away from the pelvis and right lower quadrant. The patient was found to have an enlarged and inflamed appendix. There was no evidence of perforation.  The appendix was carefully dissected. The appendix was was skeletonized with the harmonic scalpel.   The appendix was divided at its base using an endo-GIA stapler. Minimal appendiceal stump was left in place. There was no evidence of bleeding, leakage, or complication after division of the appendix. Irrigation was also performed and irrigate suctioned from the abdomen as well.  The umbilical port site was closed with the purse string suture. There was no residual palpable fascial defect.  The trocar site skin wounds were closed with 4-0 Monocryl.  Instrument, sponge, and needle counts were correct at the conclusion of the case.   Findings: The appendix was found to be inflamed. There were not signs of necrosis.  There was not perforation. There was not abscess formation.  Estimated Blood Loss:  Minimal         Drains:none         Complications:  None; patient tolerated the procedure well.         Disposition: PACU - hemodynamically stable.         Condition: stable

## 2019-03-17 NOTE — ED Notes (Signed)
Attempted to call report x 1  

## 2019-03-17 NOTE — Progress Notes (Signed)
Patient ID: Alyssa Oliver, female   DOB: [birth year], 43 y.o.   MRN: [id number]   Pre Procedure note for inpatients:   Alyssa Oliver has been scheduled for Procedure(s): APPENDECTOMY LAPAROSCOPIC (N/A) today. The various methods of treatment have been discussed with the patient. After consideration of the risks, benefits and treatment options the patient has consented to the planned procedure.   The patient has been seen and labs reviewed. There are no changes in the patient's condition to prevent proceeding with the planned procedure today.  Recent labs:  Lab Results  Component Value Date   WBC 8.8 03/17/2019   HGB 9.9 (L) 03/17/2019   HCT 31.7 (L) 03/17/2019   PLT 386 03/17/2019   GLUCOSE 108 (H) 03/17/2019   ALT 14 03/16/2019   AST 17 03/16/2019   NA 137 03/17/2019   K 3.5 03/17/2019   CL 105 03/17/2019   CREATININE 0.50 03/17/2019   BUN 5 (L) 03/17/2019   CO2 21 (L) 03/17/2019    Alyssa Keens, MD 03/17/2019 7:24 AM

## 2019-03-17 NOTE — Anesthesia Postprocedure Evaluation (Signed)
Anesthesia Post Note  Patient: Alyssa Oliver  Procedure(s) Performed: APPENDECTOMY LAPAROSCOPIC (N/A )     Patient location during evaluation: PACU Anesthesia Type: General Level of consciousness: awake and alert Pain management: pain level controlled Vital Signs Assessment: post-procedure vital signs reviewed and stable Respiratory status: spontaneous breathing, nonlabored ventilation and respiratory function stable Cardiovascular status: blood pressure returned to baseline and stable Postop Assessment: no apparent nausea or vomiting Anesthetic complications: no    Last Vitals:  Vitals:   03/17/19 1144 03/17/19 1211  BP: (!) 117/58 (!) 104/57  Pulse: 70 71  Resp: 18 18  Temp:  36.9 C  SpO2: 100% 100%    Last Pain:  Vitals:   03/17/19 1211  TempSrc: Oral  PainSc:                  Audry Pili

## 2019-03-18 ENCOUNTER — Encounter (HOSPITAL_COMMUNITY): Payer: Self-pay | Admitting: Surgery

## 2019-03-18 LAB — GLUCOSE, CAPILLARY: Glucose-Capillary: 79 mg/dL (ref 70–99)

## 2019-03-18 MED ORDER — PHENOL 1.4 % MT LIQD
1.0000 | OROMUCOSAL | Status: DC | PRN
  Administered 2019-03-18: 1 via OROMUCOSAL
  Filled 2019-03-18: qty 177

## 2019-03-18 MED ORDER — OXYCODONE HCL 5 MG PO TABS: 5.0000 mg | ORAL_TABLET | Freq: Four times a day (QID) | ORAL | 0 refills | Status: DC | PRN

## 2019-03-18 MED ORDER — IBUPROFEN 600 MG PO TABS: 600.0000 mg | ORAL_TABLET | Freq: Four times a day (QID) | ORAL | 0 refills | Status: DC | PRN

## 2019-03-18 MED ORDER — ACETAMINOPHEN 500 MG PO TABS: 1000.0000 mg | ORAL_TABLET | Freq: Four times a day (QID) | ORAL | Status: DC

## 2019-03-18 MED ORDER — IBUPROFEN 200 MG PO TABS: 600.0000 mg | ORAL_TABLET | Freq: Four times a day (QID) | ORAL | Status: DC | PRN

## 2019-03-18 MED ORDER — MENTHOL 3 MG MT LOZG
1.0000 | LOZENGE | OROMUCOSAL | Status: DC | PRN
  Filled 2019-03-18: qty 9

## 2019-03-18 NOTE — Progress Notes (Signed)
Discharged Pt to home. Pt is alert, oriented and stable. Instructions given, explained and belongings returned accordingly.

## 2019-03-18 NOTE — Discharge Summary (Signed)
[facility] Discharge Summary   Patient ID: Alyssa Oliver MRN: [id number] DOB/AGE: 02/27/1976 43 y.o.  Admit date: 03/16/2019 Discharge date: 03/18/2019  Admitting Diagnosis: Acute appendicitis  Discharge Diagnosis Patient Active Problem List   Diagnosis Date Noted  . Acute appendicitis 03/17/2019    Consultants None  Imaging: Ct Abdomen Pelvis W Contrast  Result Date: 03/17/2019 CLINICAL DATA:  Upper abdominal pain, nausea EXAM: CT ABDOMEN AND PELVIS WITH CONTRAST TECHNIQUE: Multidetector CT imaging of the abdomen and pelvis was performed using the standard protocol following bolus administration of intravenous contrast. CONTRAST:  140mL OMNIPAQUE IOHEXOL 300 MG/ML  SOLN COMPARISON:  Ultrasound 03/16/2019 FINDINGS: Lower chest: Lung bases are clear. No effusions. Heart is normal size. Hepatobiliary: Early fatty infiltration of the liver diffusely. No focal hepatic abnormality. Gallbladder unremarkable. Pancreas: No focal abnormality or ductal dilatation. Spleen: No focal abnormality.  Normal size. Adrenals/Urinary Tract: No adrenal abnormality. No focal renal abnormality. No stones or hydronephrosis. Urinary bladder is unremarkable. Stomach/Bowel: Appendix is dilated with mucosal enhancement and surrounding inflammation compatible with acute appendicitis. 11 mm in diameter. Stomach, large and small bowel grossly unremarkable. Vascular/Lymphatic: No evidence of aneurysm or adenopathy. Reproductive: Uterus and adnexa unremarkable.  No mass. Other: No free fluid or free air. Musculoskeletal: No acute bony abnormality. IMPRESSION: Dilated, inflamed appendix compatible with acute appendicitis. Early fatty infiltration of the liver. Electronically Signed   By: Rolm Baptise M.D.   On: 03/17/2019 00:21   US Abdomen Limited Ruq  Result Date: 03/16/2019 CLINICAL DATA:  Right upper quadrant abdominal pain. EXAM: ULTRASOUND ABDOMEN LIMITED RIGHT UPPER QUADRANT COMPARISON:  None. FINDINGS:  Gallbladder: Physiologically distended. No gallstones or wall thickening visualized. No sonographic Murphy sign noted by sonographer. Common bile duct: Diameter: 3 mm, normal. Liver: No focal lesion identified. Slight increase in parenchymal echogenicity compared to right kidney. Portal vein is patent on color Doppler imaging with normal direction of blood flow towards the liver. IMPRESSION: 1. Negative gallbladder.  No biliary dilatation. 2. Borderline mild hepatic steatosis. Electronically Signed   By: Keith Rake M.D.   On: 03/16/2019 23:01    Procedures Dr. Ninfa Linden (03/17/19) - Laparoscopic Appendectomy  Hospital Course:  Patient is a 43 year old female who presented to [facility] with abdominal pain.  Workup showed acute appendicitis.  Patient was admitted and underwent procedure listed above.  Tolerated procedure well and was transferred to the floor.  Diet was advanced as tolerated.  On POD#1, the patient was voiding well, tolerating diet, ambulating well, pain well controlled, vital signs stable, incisions c/d/i and felt stable for discharge home.  Patient will follow up in our office in 2 weeks and knows to call with questions or concerns. She will call to confirm appointment date/time.    Physical Exam: General:  Alert, NAD, pleasant, comfortable Abd:  Soft, ND, mild tenderness, incisions C/D/I   Allergies as of 03/18/2019      Reactions   Penicillins Itching      Medication List    TAKE these medications   acetaminophen 500 MG tablet Commonly known as: TYLENOL Take 2 tablets (1,000 mg total) by mouth every 6 (six) hours.   ferrous sulfate 325 (65 FE) MG tablet Take 325 mg by mouth daily with breakfast.   ibuprofen 200 MG tablet Commonly known as: Motrin IB Take 3 tablets (600 mg total) by mouth every 6 (six) hours as needed for moderate pain.   levothyroxine 50 MCG tablet Commonly known as: SYNTHROID Take 50 mcg by mouth daily  before breakfast.   metFORMIN 500 MG  tablet Commonly known as: GLUCOPHAGE Take 500 mg by mouth 2 (two) times daily with a meal.   oxyCODONE 5 MG immediate release tablet Commonly known as: Oxy IR/ROXICODONE Take 1 tablet (5 mg total) by mouth every 6 (six) hours as needed for severe pain.        Follow-up Information    [facility]. Call on 03/30/2019.   Specialty: General Surgery Why: Appointment scheduled for 11:30 AM. A provider will call you during scheduled appointment time. Please send a photo of incisions to [email or web address] with name and DOB the day prior to appointment. Contact information: [address] [place] [address] [phone]           Signed: Brigid Re, [facility] 03/18/2019, 7:57 AM Pager: [phone] Consults: [phone]

## 2019-03-18 NOTE — Progress Notes (Signed)
Pt complaining of a sore throat after intubation during surgery. I have given patient chloraseptic spray and Cepacol lozenges which she says do not help much. . I have offered warm drinks and cold drinks and oxycodone for pain,but she has refused the liquids and wants to wait until after breakfast to take medicine. I encouraged her to continue to use the lozenges and throat spray and to let me know if she changed her mind about the medication. She agreed that she would.

## 2019-12-09 ENCOUNTER — Ambulatory Visit (HOSPITAL_COMMUNITY)
Admission: EM | Admit: 2019-12-09 | Discharge: 2019-12-09 | Disposition: A | Discharge status: Home / Self Care | Payer: 59 | Attending: Internal Medicine | Admitting: Internal Medicine

## 2019-12-09 ENCOUNTER — Other Ambulatory Visit: Payer: Self-pay

## 2019-12-09 ENCOUNTER — Encounter (HOSPITAL_COMMUNITY): Payer: Self-pay | Admitting: Emergency Medicine

## 2019-12-09 DIAGNOSIS — R519 Headache, unspecified: Secondary | ICD-10-CM | POA: Diagnosis not present

## 2019-12-09 DIAGNOSIS — G4486 Cervicogenic headache: Secondary | ICD-10-CM

## 2019-12-09 LAB — CBC WITH DIFFERENTIAL/PLATELET
Abs Immature Granulocytes: 0.02 10*3/uL (ref 0.00–0.07)
Basophils Absolute: 0.1 10*3/uL (ref 0.0–0.1)
Basophils Relative: 1 %
Eosinophils Absolute: 0.1 10*3/uL (ref 0.0–0.5)
Eosinophils Relative: 1 %
HCT: 38.9 % (ref 36.0–46.0)
Hemoglobin: 12.6 g/dL (ref 12.0–15.0)
Immature Granulocytes: 0 %
Lymphocytes Relative: 26 %
Lymphs Abs: 1.4 10*3/uL (ref 0.7–4.0)
MCH: 30.5 pg (ref 26.0–34.0)
MCHC: 32.4 g/dL (ref 30.0–36.0)
MCV: 94.2 fL (ref 80.0–100.0)
Monocytes Absolute: 0.5 10*3/uL (ref 0.1–1.0)
Monocytes Relative: 10 %
Neutro Abs: 3.4 10*3/uL (ref 1.7–7.7)
Neutrophils Relative %: 62 %
Platelets: 392 10*3/uL (ref 150–400)
RBC: 4.13 MIL/uL (ref 3.87–5.11)
RDW: 13.9 % (ref 11.5–15.5)
WBC: 5.5 10*3/uL (ref 4.0–10.5)
nRBC: 0 % (ref 0.0–0.2)

## 2019-12-09 LAB — COMPREHENSIVE METABOLIC PANEL
ALT: 15 U/L (ref 0–44)
AST: 17 U/L (ref 15–41)
Albumin: 3.9 g/dL (ref 3.5–5.0)
Alkaline Phosphatase: 61 U/L (ref 38–126)
Anion gap: 9 (ref 5–15)
BUN: 12 mg/dL (ref 6–20)
CO2: 25 mmol/L (ref 22–32)
Calcium: 9.6 mg/dL (ref 8.9–10.3)
Chloride: 105 mmol/L (ref 98–111)
Creatinine, Ser: 0.71 mg/dL (ref 0.44–1.00)
GFR calc Af Amer: >60 mL/min (ref >60)
GFR calc non Af Amer: >60 mL/min (ref >60)
Glucose, Bld: 113 mg/dL — ABNORMAL HIGH (ref 70–99)
Potassium: 4 mmol/L (ref 3.5–5.1)
Sodium: 139 mmol/L (ref 135–145)
Total Bilirubin: 0.4 mg/dL (ref 0.3–1.2)
Total Protein: 6.9 g/dL (ref 6.5–8.1)

## 2019-12-09 LAB — HEMOGLOBIN A1C
Hgb A1c MFr Bld: 5 % (ref 4.8–5.6)
Mean Plasma Glucose: 96.8 mg/dL

## 2019-12-09 LAB — TSH: TSH: 4.377 u[IU]/mL (ref 0.350–4.500)

## 2019-12-09 MED ORDER — METHOCARBAMOL 500 MG PO TABS: 500.0000 mg | ORAL_TABLET | Freq: Three times a day (TID) | ORAL | 0 refills | Status: DC | PRN

## 2019-12-09 MED ORDER — METFORMIN HCL 500 MG PO TABS: 500.0000 mg | ORAL_TABLET | Freq: Two times a day (BID) | ORAL | 3 refills | Status: DC

## 2019-12-09 NOTE — ED Triage Notes (Addendum)
headache since Tuesday.  Patient purchased a blood pressure machine.  138/70 something, patient requesting cbg

## 2019-12-13 NOTE — ED Provider Notes (Signed)
[facility]    CSN: [id number] Arrival date & time: 12/09/19  1914      History   Chief Complaint Chief Complaint  Patient presents with  . Headache    HPI Alyssa Oliver is a 44 y.o. female with history of diabetes mellitus type 2 on Metformin comes to urgent care with complaints of headache over the occipital region as well as posterior aspect of her neck.  Symptoms have been ongoing for the past 3 to 4 days.  Headache is throbbing and of moderate severity.  No preceding events.  No nausea or vomiting.  No known relieving factors.  Headache is associated with neck pain and muscle stiffness over the shoulders as well as the neck.  Patient uses several pillows when sleeping.  No photophobia or visual changes.  No numbness or tingling.  No extremity weakness.  Patient is not consistently compliant with her Metformin.  HPI  Past Medical History:  Diagnosis Date  . Diabetes mellitus without complication ([place])   . Thyroid disease     Patient Active Problem List   Diagnosis Date Noted  . Acute appendicitis 03/17/2019    Past Surgical History:  Procedure Laterality Date  . APPENDECTOMY  03/17/2019  . CESAREAN SECTION    . LAPAROSCOPIC APPENDECTOMY N/A 03/17/2019   Procedure: APPENDECTOMY LAPAROSCOPIC;  Surgeon: Coralie Keens, MD;  Location: [place];  Service: General;  Laterality: N/A;    OB History   No obstetric history on file.      Home Medications    Prior to Admission medications   Medication Sig Start Date End Date Taking? Authorizing Provider  ferrous sulfate 325 (65 FE) MG tablet Take 325 mg by mouth daily with breakfast.   Yes [provider]  levothyroxine (SYNTHROID, LEVOTHROID) 50 MCG tablet Take 50 mcg by mouth daily before breakfast.   Yes [provider]  acetaminophen (TYLENOL) 500 MG tablet Take 2 tablets (1,000 mg total) by mouth every 6 (six) hours. 03/18/19   Rayburn, Floyce Stakes, PA-C  ibuprofen (ADVIL) 600 MG tablet Take 1  tablet (600 mg total) by mouth every 6 (six) hours as needed for moderate pain. 03/18/19   Rayburn, Floyce Stakes, PA-C  metFORMIN (GLUCOPHAGE) 500 MG tablet Take 1 tablet (500 mg total) by mouth 2 (two) times daily with a meal. 12/09/19 01/08/20  Vika Buske, Myrene Galas, MD  methocarbamol (ROBAXIN) 500 MG tablet Take 1 tablet (500 mg total) by mouth every 8 (eight) hours as needed for muscle spasms. 12/09/19   Shanyn Preisler, Myrene Galas, MD    Family History History reviewed. No pertinent family history.  Social History Social History   Tobacco Use  . Smoking status: Never Smoker  . Smokeless tobacco: Never Used  Substance Use Topics  . Alcohol use: Not Currently  . Drug use: Not on file     Allergies   Penicillins   Review of Systems Review of Systems  Constitutional: Negative for activity change, chills and fever.  Respiratory: Negative for cough, chest tightness and stridor.   Gastrointestinal: Negative for abdominal pain, nausea and vomiting.  Genitourinary: Negative.   Musculoskeletal: Negative for arthralgias, gait problem and joint swelling.  Skin: Negative.   Neurological: Positive for headaches. Negative for dizziness, seizures, syncope, facial asymmetry, light-headedness and numbness.  Psychiatric/Behavioral: Negative for confusion and decreased concentration.     Physical Exam Triage Vital Signs ED Triage Vitals  Enc Vitals Group     BP 12/09/19 2036 136/90     Pulse  Rate 12/09/19 2036 83     Resp 12/09/19 2036 20     Temp 12/09/19 2036 99.4 F (37.4 C)     Temp Source 12/09/19 2036 Oral     SpO2 12/09/19 2036 99 %     Weight --      Height --      Head Circumference --      Peak Flow --      Pain Score 12/09/19 2033 0     Pain Loc --      Pain Edu? --      Excl. in [place]? --    No data found.  Updated Vital Signs BP 136/90 (BP Location: Left Arm) Comment (BP Location): large cuff  Pulse 83   Temp 99.4 F (37.4 C) (Oral)   Resp 20   LMP 11/14/2019   SpO2 99%    Visual Acuity Right Eye Distance:   Left Eye Distance:   Bilateral Distance:    Right Eye Near:   Left Eye Near:    Bilateral Near:     Physical Exam Vitals and nursing note reviewed.  Constitutional:      General: She is not in acute distress.    Appearance: She is well-developed. She is not ill-appearing.  Cardiovascular:     Heart sounds: Normal heart sounds.  Pulmonary:     Effort: Pulmonary effort is normal.     Breath sounds: No wheezing, rhonchi or rales.  Abdominal:     General: Bowel sounds are normal.     Palpations: Abdomen is soft.  Musculoskeletal:        General: Normal range of motion.     Comments: Tightness over the trapezius muscles.  Full range of motion of the cervical spine.  Neurological:     Mental Status: She is alert and oriented to person, place, and time. Mental status is at baseline.     GCS: GCS eye subscore is 4. GCS verbal subscore is 5. GCS motor subscore is 6.     Cranial Nerves: No cranial nerve deficit, dysarthria or facial asymmetry.     Sensory: No sensory deficit.     Motor: No weakness.      UC Treatments / Results  Labs (all labs ordered are listed, but only abnormal results are displayed) Labs Reviewed  COMPREHENSIVE METABOLIC PANEL - Abnormal; Notable for the following components:      Result Value   Glucose, Bld 113 (*)    All other components within normal limits  CBC WITH DIFFERENTIAL/PLATELET  TSH  HEMOGLOBIN A1C    EKG   Radiology No results found.  Procedures Procedures (including critical care time)  Medications Ordered in UC Medications - No data to display  Initial Impression / Assessment and Plan / UC Course  I have reviewed the triage vital signs and the nursing notes.  Pertinent labs & imaging results that were available during my care of the patient were reviewed by me and considered in my medical decision making (see chart for details).     1.  Cervicogenic headache: Robaxin 500 mg every 8  hours as needed for muscle spasms Patient is advised to assess how many pills that she would need for better alignment of the cervical spine during sleep Heat therapy Gentle range of motion exercises  2.  Diabetes mellitus type 2, uncontrolled-medication noncompliance: Metformin 500 mg twice daily Medication compliance was emphasized during the visit Return precautions were given. Final Clinical Impressions(s) / UC Diagnoses  Final diagnoses:  Cervicogenic headache   Discharge Instructions   None    ED Prescriptions    Medication Sig Dispense Auth. Provider   metFORMIN (GLUCOPHAGE) 500 MG tablet Take 1 tablet (500 mg total) by mouth 2 (two) times daily with a meal. 60 tablet Cordarrel Stiefel, Myrene Galas, MD   methocarbamol (ROBAXIN) 500 MG tablet Take 1 tablet (500 mg total) by mouth every 8 (eight) hours as needed for muscle spasms. 20 tablet Octavia Mottola, Myrene Galas, MD     PDMP not reviewed this encounter.   Chase Picket, MD 12/13/19 [phone]

## 2020-01-10 DIAGNOSIS — N920 Excessive and frequent menstruation with regular cycle: Secondary | ICD-10-CM | POA: Insufficient documentation

## 2020-05-19 ENCOUNTER — Other Ambulatory Visit: Payer: 59

## 2020-05-19 ENCOUNTER — Other Ambulatory Visit: Payer: Self-pay

## 2020-05-19 DIAGNOSIS — Z20822 Contact with and (suspected) exposure to covid-19: Secondary | ICD-10-CM

## 2020-05-20 LAB — SARS-COV-2, NAA 2 DAY TAT

## 2020-05-20 LAB — NOVEL CORONAVIRUS, NAA: SARS-CoV-2, NAA: NOT DETECTED

## 2020-05-26 ENCOUNTER — Other Ambulatory Visit: Payer: Self-pay

## 2020-05-26 ENCOUNTER — Other Ambulatory Visit: Payer: Self-pay | Admitting: Critical Care Medicine

## 2020-05-26 ENCOUNTER — Other Ambulatory Visit: Payer: No Typology Code available for payment source

## 2020-05-26 DIAGNOSIS — Z20822 Contact with and (suspected) exposure to covid-19: Secondary | ICD-10-CM

## 2020-05-27 ENCOUNTER — Telehealth: Payer: Self-pay

## 2020-05-27 NOTE — Telephone Encounter (Signed)
Pt called for covid results- advised that results are not back.

## 2020-05-27 NOTE — Telephone Encounter (Signed)
Pt  called for covid results -advised results are not back.  

## 2020-05-28 LAB — NOVEL CORONAVIRUS, NAA: SARS-CoV-2, NAA: NOT DETECTED

## 2020-05-28 LAB — SARS-COV-2, NAA 2 DAY TAT

## 2020-05-28 NOTE — Telephone Encounter (Signed)
Pt called for test results- advised that results re not back.

## 2020-09-11 ENCOUNTER — Other Ambulatory Visit: Payer: Self-pay | Admitting: Obstetrics and Gynecology

## 2020-09-11 ENCOUNTER — Other Ambulatory Visit: Payer: Self-pay | Admitting: Neurosurgery

## 2020-09-12 ENCOUNTER — Other Ambulatory Visit: Payer: Self-pay | Admitting: Obstetrics and Gynecology

## 2020-09-12 DIAGNOSIS — D259 Leiomyoma of uterus, unspecified: Secondary | ICD-10-CM

## 2020-10-07 ENCOUNTER — Other Ambulatory Visit: Payer: No Typology Code available for payment source

## 2020-10-07 ENCOUNTER — Telehealth: Payer: Self-pay | Admitting: *Deleted

## 2020-10-07 DIAGNOSIS — Z20822 Contact with and (suspected) exposure to covid-19: Secondary | ICD-10-CM

## 2020-10-07 NOTE — Telephone Encounter (Signed)
Pt calling for covid results; pending, verbalizes understanding.

## 2020-10-08 ENCOUNTER — Telehealth: Payer: Self-pay

## 2020-10-09 ENCOUNTER — Ambulatory Visit: Payer: No Typology Code available for payment source

## 2020-10-09 ENCOUNTER — Other Ambulatory Visit: Payer: No Typology Code available for payment source

## 2020-10-09 DIAGNOSIS — Z20822 Contact with and (suspected) exposure to covid-19: Secondary | ICD-10-CM

## 2020-10-10 LAB — NOVEL CORONAVIRUS, NAA: SARS-CoV-2, NAA: DETECTED — AB

## 2020-10-11 LAB — SARS-COV-2, NAA 2 DAY TAT

## 2020-10-11 LAB — NOVEL CORONAVIRUS, NAA: SARS-CoV-2, NAA: DETECTED — AB

## 2020-10-28 ENCOUNTER — Other Ambulatory Visit: Payer: No Typology Code available for payment source

## 2020-12-18 ENCOUNTER — Other Ambulatory Visit: Payer: No Typology Code available for payment source

## 2020-12-22 ENCOUNTER — Other Ambulatory Visit: Payer: No Typology Code available for payment source

## 2020-12-23 ENCOUNTER — Inpatient Hospital Stay: Admission: RE | Admit: 2020-12-23 | Payer: No Typology Code available for payment source | Source: Ambulatory Visit

## 2020-12-29 DIAGNOSIS — I10 Essential (primary) hypertension: Secondary | ICD-10-CM | POA: Insufficient documentation

## 2021-06-01 ENCOUNTER — Other Ambulatory Visit: Payer: Self-pay | Admitting: Obstetrics and Gynecology

## 2021-06-01 DIAGNOSIS — N920 Excessive and frequent menstruation with regular cycle: Secondary | ICD-10-CM

## 2021-07-06 ENCOUNTER — Ambulatory Visit
Admission: RE | Admit: 2021-07-06 | Discharge: 2021-07-06 | Disposition: A | Discharge status: Home / Self Care | Payer: No Typology Code available for payment source | Source: Ambulatory Visit | Attending: Obstetrics and Gynecology | Admitting: Obstetrics and Gynecology

## 2021-07-06 ENCOUNTER — Other Ambulatory Visit: Payer: Self-pay

## 2021-07-06 DIAGNOSIS — N920 Excessive and frequent menstruation with regular cycle: Secondary | ICD-10-CM

## 2021-07-06 MED ORDER — GADOBENATE DIMEGLUMINE 529 MG/ML IV SOLN
20.0000 mL | Freq: Once | INTRAVENOUS | Status: AC | PRN
  Administered 2021-07-06: 20 mL via INTRAVENOUS

## 2021-07-19 ENCOUNTER — Telehealth: Payer: Self-pay

## 2021-07-19 NOTE — Telephone Encounter (Signed)
Attempted to return patient call regarding referral from Dr. Terri Piedra to GYN Oncology. Voicemail full, unable to leave message.

## 2021-07-19 NOTE — Telephone Encounter (Signed)
Patient called the office back and was given a new patient appt for 10/31 with Dr Berline Lopes at 9:45 am. Patient given the address and phone number for the clinic.

## 2021-07-30 ENCOUNTER — Inpatient Hospital Stay (HOSPITAL_BASED_OUTPATIENT_CLINIC_OR_DEPARTMENT_OTHER): Payer: No Typology Code available for payment source | Admitting: Gynecologic Oncology

## 2021-07-30 ENCOUNTER — Inpatient Hospital Stay: Payer: No Typology Code available for payment source | Attending: Gynecologic Oncology

## 2021-07-30 ENCOUNTER — Other Ambulatory Visit: Payer: Self-pay

## 2021-07-30 ENCOUNTER — Other Ambulatory Visit (HOSPITAL_COMMUNITY)
Admission: RE | Admit: 2021-07-30 | Discharge: 2021-07-30 | Disposition: A | Discharge status: Home / Self Care | Payer: No Typology Code available for payment source | Source: Ambulatory Visit | Attending: Gynecologic Oncology | Admitting: Gynecologic Oncology

## 2021-07-30 ENCOUNTER — Telehealth: Payer: Self-pay

## 2021-07-30 ENCOUNTER — Encounter: Payer: Self-pay | Admitting: Gynecologic Oncology

## 2021-07-30 VITALS — BP 154/62 | HR 77 | Temp 98.5°F | Resp 18 | Ht 65.0 in | Wt 247.0 lb

## 2021-07-30 DIAGNOSIS — Z6841 Body Mass Index (BMI) 40.0 and over, adult: Secondary | ICD-10-CM | POA: Diagnosis not present

## 2021-07-30 DIAGNOSIS — D398 Neoplasm of uncertain behavior of other specified female genital organs: Secondary | ICD-10-CM

## 2021-07-30 DIAGNOSIS — D259 Leiomyoma of uterus, unspecified: Secondary | ICD-10-CM | POA: Diagnosis present

## 2021-07-30 DIAGNOSIS — E119 Type 2 diabetes mellitus without complications: Secondary | ICD-10-CM | POA: Insufficient documentation

## 2021-07-30 DIAGNOSIS — Z79899 Other long term (current) drug therapy: Secondary | ICD-10-CM | POA: Diagnosis not present

## 2021-07-30 DIAGNOSIS — E079 Disorder of thyroid, unspecified: Secondary | ICD-10-CM | POA: Insufficient documentation

## 2021-07-30 DIAGNOSIS — N939 Abnormal uterine and vaginal bleeding, unspecified: Secondary | ICD-10-CM | POA: Insufficient documentation

## 2021-07-30 DIAGNOSIS — N9489 Other specified conditions associated with female genital organs and menstrual cycle: Secondary | ICD-10-CM | POA: Insufficient documentation

## 2021-07-30 DIAGNOSIS — D509 Iron deficiency anemia, unspecified: Secondary | ICD-10-CM | POA: Diagnosis not present

## 2021-07-30 DIAGNOSIS — Z7984 Long term (current) use of oral hypoglycemic drugs: Secondary | ICD-10-CM | POA: Insufficient documentation

## 2021-07-30 DIAGNOSIS — Z124 Encounter for screening for malignant neoplasm of cervix: Secondary | ICD-10-CM | POA: Insufficient documentation

## 2021-07-30 LAB — CBC (CANCER CENTER ONLY)
HCT: 38.4 % (ref 36.0–46.0)
Hemoglobin: 12.6 g/dL (ref 12.0–15.0)
MCH: 28.5 pg (ref 26.0–34.0)
MCHC: 32.8 g/dL (ref 30.0–36.0)
MCV: 86.9 fL (ref 80.0–100.0)
Platelet Count: 427 10*3/uL — ABNORMAL HIGH (ref 150–400)
RBC: 4.42 MIL/uL (ref 3.87–5.11)
RDW: 15.1 % (ref 11.5–15.5)
WBC Count: 5.5 10*3/uL (ref 4.0–10.5)
nRBC: 0 % (ref 0.0–0.2)

## 2021-07-30 LAB — HCG, SERUM, QUALITATIVE: Preg, Serum: NEGATIVE

## 2021-07-30 NOTE — Telephone Encounter (Signed)
Attempted to reach patient to review lab results. Unable to contact her and unable to leave voicemail.

## 2021-07-30 NOTE — Patient Instructions (Addendum)
Please call the office at [phone] when you have decided on a surgery date. We will plan on getting lab work today and will contact you with the results. Based on the results, we may refer you to the Anemia clinic here at the [facility]. We will contact you with the results of your pap smear, sample of cells from the cervix. We will tentatively hold a date for you for surgery when you have decided on a date.  We will arrange for you to have a preoperative appointment with Tabathia Knoche closer to the surgery date to discuss instructions for before and after in detail. You may also receive a phone call to arrange for a pre-operative appointment at the hospital as well. We usually try to group these appointments together.

## 2021-07-30 NOTE — Progress Notes (Signed)
GYNECOLOGIC ONCOLOGY NEW PATIENT CONSULTATION   Patient Name: Alyssa Oliver  Patient Age: 45 y.o. Date of Service: 07/30/21 Referring Provider: Dr. Carlynn Purl  Primary Care Provider: Patient, No Pcp Per (Inactive) Consulting Provider: Jeral Pinch, MD   Assessment/Plan:  Premenopausal patient with enlarged fibroid uterus and complex adnexal mass.  Reviewed MRI images with the patients. Discussed findings of multiple uterine fibroids as well as adnexal/para-uterine complex mass that may be degenerating fibroid versus ovarian. She had a CA-125 that was mildly elevated in December. I do not recommend repeating this. We discussed that this test is not diagnostic for cancer and that many benign disease processes, such as uterine fibroids, can cause its elevation. The only way to make a diagnosis with regards to her adnexal mass is to proceed with surgery for mass excision. Given complex mass but also symptomatic fibroids, I discussed my recommendation today to proceed with total hysterectomy and likely USO (if mass involves one of her ovaries).   She has AUB that I suspect is related to uterine fibroids as well as fibroid dilating her endocervix. Given prolapsing fibroid, endometrial biopsy is not feasible. She previously underwent attempt at endometrial biopsy without endometrial tissue identified on pathology. I would plan to send uterus for gross examination by pathology at the time of her surgery and ask them to perform a frozen section if any findings raise concern for uterine pathology.   Given significant bleeding history, I have recommended CBC today to rule out anemia. If the patient were to have iron-deficiency anemia secondary to blood loss, would plan for referral to [facility] anemia clinic for consideration of iron infusions were pre-operative blood transfusion.  Although the size of her uterus is going to necessitate delivery through an abdominal incision, I recommend that we plan to do  the surgery robotically with mini-lap for specimen removal.   Today, we discussed plan for robotic total hysterectomy, pelvic mass excision, bilateral salpingectomy, possible unilateral oophorectomy, possible staging, mini-laparotomy, and any other indicated procedures. The patient would like to wait to have surgery after Thanksgiving. She will return closer to the date of her surgery for preoperative teaching. The risks of surgery were discussed and she understands these to include infection; wound separation; hernia; vaginal cuff separation, injury to adjacent organs such as bowel, bladder, blood vessels, ureters and nerves; bleeding which may require blood transfusion; anesthesia risk; thromboembolic events; possible death; unforeseen complications; possible need for re-exploration; medical complications such as heart attack, stroke, pleural effusion and pneumonia. The patient will receive DVT and antibiotic prophylaxis as indicated. She voiced a clear understanding.   Pap test was performed today as her last was in 2018 (not known if she had HPV testing).  A copy of this note was sent to the patient's referring provider.   75 minutes of total time was spent for this patient encounter, including preparation, face-to-face counseling with the patient and coordination of care, and documentation of the encounter.   Jeral Pinch, MD  Division of Gynecologic Oncology  Department of Obstetrics and Gynecology  [facility]  ___________________________________________  Chief Complaint: Chief Complaint  Patient presents with   Adnexal mass    History of Present Illness:  Alyssa Oliver is a 45 y.o. y.o. female who is seen in consultation at the request of Dr. Terri Piedra for an evaluation of complex adnexal mass, known large fibroid uterus.  Patient endorses a long history of fibroids.  At after moving to [facility] from [place] in 2018,  she notes progressive worsening of  her menses.  She has regular menses approximately once a month but will have bleeding that lasts up to 2 to 3 weeks every month.  On her heaviest days, she will wear 2 super pads and go through a pack a day.  She has previously tried several types of birth control pills to help manage her bleeding but had significant clots when she used OCPs.  She more recently has been on Lysteda which has helped decrease her flow.  She starts this when her menses started each month.  She endorses pelvic pain and cramping with her menses.  At the time of her visit at the end of 2021, in the setting of abnormal bleeding, the patient underwent endometrial biopsy.  Pathology from this revealed benign endocervical tissue, no endometrial tissue.  She endorses a good appetite without nausea or emesis.  She denies any urinary symptoms and reports normal bowel function.  Given a history of iron deficiency anemia, she has been on iron for months.  She has thyroid disease and now is on levo thyroxine.  She had previously been on methimazole at some point while she was in [place] but then stopped stopped medication completely as she was told her thyroid was functioning normally.  After restarting thyroid medication with levothyroxine after moving to [facility], her periods improve some initially.  Patient lives in [place] with her 2 children who are 53 and 43.  Her husband is currently in the [place] although was just recently in town.  PAST MEDICAL HISTORY:  Past Medical History:  Diagnosis Date   Acute appendicitis 03/17/2019   Diabetes mellitus without complication ([place])    Thyroid disease      PAST SURGICAL HISTORY:  Past Surgical History:  Procedure Laterality Date   APPENDECTOMY  03/17/2019   CESAREAN SECTION     LAPAROSCOPIC APPENDECTOMY N/A 03/17/2019   Procedure: APPENDECTOMY LAPAROSCOPIC;  Surgeon: Coralie Keens, MD;  Location: [place];  Service: General;  Laterality: N/A;    OB/GYN  HISTORY:  OB History  Gravida Para Term Preterm AB Living  3 2     1     SAB IAB Ectopic Multiple Live Births               # Outcome Date GA Lbr Len/2nd Weight Sex Delivery Anes PTL Lv  3 AB           2 Para           1 Para             No LMP recorded.  Age at menarche: 65  Age at menopause: Not applicable Hx of HRT: Not applicable Hx of STDs: Denies Last pap: 2018 - negative History of abnormal pap smears: no  SCREENING STUDIES:  Last mammogram: 2020  Last colonoscopy: Has never had  MEDICATIONS: Outpatient Encounter Medications as of 07/30/2021  Medication Sig   ammonium lactate (LAC-HYDRIN) 12 % lotion Apply topically.   Ferrous Fumarate (HEMOCYTE - 106 MG FE) 324 (106 Fe) MG TABS tablet TAKE 1 TABLET EVERY DAY BY ORAL ROUTE AS DIRECTED FOR 60 DAYS.   hydrochlorothiazide (HYDRODIURIL) 12.5 MG tablet Take 12.5 mg by mouth daily.   levothyroxine (SYNTHROID, LEVOTHROID) 50 MCG tablet Take 50 mcg by mouth daily before breakfast.   metFORMIN (GLUCOPHAGE) 500 MG tablet Take 1 tablet by mouth 2 (two) times daily with a meal.   tranexamic acid (LYSTEDA) 650 MG TABS tablet TAKE 2 TABLET(S)  3 TIMES A DAY BY ORAL ROUTE AS DIRECTED FOR 5 DAYS.   acetaminophen (TYLENOL) 500 MG tablet Take 2 tablets (1,000 mg total) by mouth every 6 (six) hours. (Patient not taking: Reported on 07/30/2021)   ferrous sulfate 325 (65 FE) MG tablet Take 325 mg by mouth daily with breakfast. (Patient not taking: Reported on 07/30/2021)   ibuprofen (ADVIL) 600 MG tablet Take 1 tablet (600 mg total) by mouth every 6 (six) hours as needed for moderate pain. (Patient not taking: Reported on 07/30/2021)   metFORMIN (GLUCOPHAGE) 500 MG tablet Take 1 tablet (500 mg total) by mouth 2 (two) times daily with a meal.   methocarbamol (ROBAXIN) 500 MG tablet Take 1 tablet (500 mg total) by mouth every 8 (eight) hours as needed for muscle spasms. (Patient not taking: Reported on 07/30/2021)   No facility-administered  encounter medications on file as of 07/30/2021.    ALLERGIES:  Allergies  Allergen Reactions   Penicillins Itching   Sulfa Antibiotics Itching     FAMILY HISTORY:  Family History  Problem Relation Age of Onset   Colon cancer Neg Hx    Breast cancer Neg Hx    Ovarian cancer Neg Hx    Endometrial cancer Neg Hx    Pancreatic cancer Neg Hx    Prostate cancer Neg Hx      SOCIAL HISTORY:  Social Connections: Not on file    REVIEW OF SYSTEMS:  Denies appetite changes, fevers, chills, fatigue, unexplained weight changes. Denies hearing loss, neck lumps or masses, mouth sores, ringing in ears or voice changes. Denies cough or wheezing.  Denies shortness of breath. Denies chest pain or palpitations. Denies leg swelling. Denies abdominal distention, pain, blood in stools, constipation, diarrhea, nausea, vomiting, or early satiety. Denies pain with intercourse, dysuria, frequency, hematuria or incontinence. Denies hot flashes, pelvic pain, vaginal bleeding or vaginal discharge.   Denies joint pain, back pain or muscle pain/cramps. Denies itching, rash, or wounds. Denies dizziness, headaches, numbness or seizures. Denies swollen lymph nodes or glands, denies easy bruising or bleeding. Denies anxiety, depression, confusion, or decreased concentration.  Physical Exam:  Vital Signs for this encounter:  Blood pressure (!) 154/62, pulse 77, temperature 98.5 F (36.9 C), temperature source Oral, resp. rate 18, height 5\' 5"  (1.651 m), weight 247 lb (112 kg), SpO2 100 %. Body mass index is 41.1 kg/m. General: Alert, oriented, no acute distress.  HEENT: Normocephalic, atraumatic. Sclera anicteric.  Chest: Clear to auscultation bilaterally. No wheezes, rhonchi, or rales. Cardiovascular: Regular rate and rhythm, no murmurs, rubs, or gallops.  Abdomen: Obese. Normoactive bowel sounds. Soft, nondistended, nontender to palpation. No masses or hepatosplenomegaly appreciated. No palpable fluid  wave.  Extremities: Grossly normal range of motion. Warm, well perfused. No edema bilaterally.  Skin: No rashes or lesions.  Lymphatics: No cervical, supraclavicular, or inguinal adenopathy.  GU:  Normal external female genitalia. No lesions. No discharge or bleeding.             Bladder/urethra:  No lesions or masses, well supported bladder             Vagina: Well rugated, no lesions or masses.             Cervix: Normal appearing, no lesions.  While cervix itself is normal in appearance, it is dilated approximately 3 cm with what appears to be a fibroid prolapsing into but not through the cervix.  Pap test collected.  Uterus/adnexa: Enlarged, moderately mobile uterus that spans 14-16 cm.  Difficult to appreciate fibroids is separate from adnexa secondary to size of the uterus and patient's body habitus.  No parametrial involvement or nodularity appreciated.  Lower uterine segment is not significantly enlarged.    Rectal: Deferred.  LABORATORY AND RADIOLOGIC DATA:  Outside medical records were reviewed to synthesize the above history, along with the history and physical obtained during the visit.   CA-125 on 09/09/20: 48  Lab Results  Component Value Date   WBC 5.5 07/30/2021   HGB 12.6 07/30/2021   HCT 38.4 07/30/2021   PLT 427 (H) 07/30/2021   GLUCOSE 113 (H) 12/09/2019   ALT 15 12/09/2019   AST 17 12/09/2019   NA 139 12/09/2019   K 4.0 12/09/2019   CL 105 12/09/2019   CREATININE 0.71 12/09/2019   BUN 12 12/09/2019   CO2 25 12/09/2019   TSH 4.377 12/09/2019   HGBA1C 5.0 12/09/2019   EMB on 09/08/20: Benign cervical glandular mucosa, no dysplasia or malignancy  Pelvic ultrasound 09/08/20 at [facility]: Large fibroid uterus measuring 20.7 x 16.4 x 7 cm.  Multiple fibroids measured including largest 12.6 x 4.5 x 11 cm.  2 smaller measuring 5.7 x 4.3 x 6.1 cm and 4.9 x 4.2 x 4.5 cm.  Right ovary not visualized.  Left ovary within normal limits.  Pelvic MRI  07/06/21: IMPRESSION: Large indeterminate LEFT adnexal/para-uterine mass with potential connection to the LEFT lateral uterus, as described. This appears to be associated with the uterus and involving or adjacent to a fundal leiomyoma, also adjacent to the LEFT ovary with slow growth since 2020. Findings may reflect a pelvic sarcoma, cystic ovarian neoplasm or perhaps sequela of remote torsed pedunculated fibroid with variable degrees of necrosis/limited blood supply.   Concerning features associated with the above finding are the heterogeneous enhancement pattern and the presence of ascites, for this reason gyn consultation is suggested as neoplasm is the primary differential consideration at this time, perhaps indolent given slow enlargement since 2020.   Numerous leiomyomata with intracavitary/entirely submucosal cervical leiomyoma versus is prolapsed leiomyoma extending from lower uterine segment into the cervix.   Endometrium is distorted by numerous leiomyomata, not grossly thickened or heterogeneous but not well assessed. Correlation with direct visualization may ultimately be warranted, potentially given the potential for prolapsed leiomyoma.   Normal RIGHT ovary.

## 2021-07-31 ENCOUNTER — Telehealth: Payer: Self-pay | Admitting: *Deleted

## 2021-07-31 NOTE — Telephone Encounter (Signed)
Attempted to reach patient with lab results. Unable to reach patient and unable to leave voicemail.

## 2021-07-31 NOTE — Telephone Encounter (Signed)
Nurse from Dr Cristal Deer office called and explained that "Dr Terri Piedra would like to be notified when the patient is set up for surgery and pre op with our office. Dr Terri Piedra would like to attend the pre op appt." Message given to Dr Berline Lopes and Lenna Sciara APP

## 2021-08-01 LAB — CYTOLOGY - PAP
Adequacy: ABSENT
Comment: NEGATIVE
Diagnosis: NEGATIVE
High risk HPV: NEGATIVE

## 2021-08-02 ENCOUNTER — Telehealth: Payer: Self-pay | Admitting: *Deleted

## 2021-08-02 NOTE — Telephone Encounter (Signed)
Returned the patient's call and explained that Dr Berline Lopes and Lenna Sciara APP were in the [place], but the office got her message requesting surgery on 12/8 and the message would be given to them.

## 2021-08-03 ENCOUNTER — Other Ambulatory Visit: Payer: Self-pay | Admitting: Gynecologic Oncology

## 2021-08-03 ENCOUNTER — Telehealth: Payer: Self-pay

## 2021-08-03 ENCOUNTER — Encounter: Payer: Self-pay | Admitting: Gynecologic Oncology

## 2021-08-03 DIAGNOSIS — N9489 Other specified conditions associated with female genital organs and menstrual cycle: Secondary | ICD-10-CM

## 2021-08-03 DIAGNOSIS — D259 Leiomyoma of uterus, unspecified: Secondary | ICD-10-CM

## 2021-08-03 DIAGNOSIS — N939 Abnormal uterine and vaginal bleeding, unspecified: Secondary | ICD-10-CM

## 2021-08-03 NOTE — Telephone Encounter (Addendum)
Patient's VM full. Will follow up. Need to let Ms Fitzgibbons know that her pap smear from 07-30-21 was normal and HPV hi risk negative per [name], NP.

## 2021-08-06 NOTE — Telephone Encounter (Signed)
LM for Alyssa Oliver that her pap smear from 07-30-21 was normal and HPV hi risk negative per [name], NP. She can call the office at [phone] if she has any questions or concerns.

## 2021-08-09 NOTE — Telephone Encounter (Signed)
Spoke with the patient and scheduled a pre op appt with Melissa APP

## 2021-08-30 ENCOUNTER — Telehealth: Payer: Self-pay

## 2021-08-30 NOTE — Patient Instructions (Incomplete)
Preparing for your Surgery  Plan for surgery on *** with Dr. Marland Kitchen at [facility] ***. You will be scheduled for a ***.   Pre-operative Testing -You will receive a phone call from presurgical testing at [facility] *** to arrange for a pre-operative appointment and lab work.  -Bring your insurance card, copy of an advanced directive if applicable, medication list  -At that visit, you will be asked to sign a consent for a possible blood transfusion in case a transfusion becomes necessary during surgery.  The need for a blood transfusion is rare but having consent is a necessary part of your care.     -You should not be taking blood thinners or aspirin at least ten days prior to surgery unless instructed by your surgeon.  -Do not take supplements such as fish oil (omega 3), red yeast rice, turmeric before your surgery. You want to avoid medications with aspirin in them including headache powders such as BC or Goody's), Excedrin migraine.  Day Before Surgery at [place] will be asked to take in a light diet the day before surgery. You will be advised you can have clear liquids up until 3 hours before your surgery.    Eat a light diet the day before surgery.  Examples including soups, broths, toast, yogurt, mashed potatoes.  AVOID GAS PRODUCING FOODS. Things to avoid include carbonated beverages (fizzy beverages, sodas), raw fruits and raw vegetables (uncooked), or beans.   If your bowels are filled with gas, your surgeon will have difficulty visualizing your pelvic organs which increases your surgical risks.  Your role in recovery Your role is to become active as soon as directed by your doctor, while still giving yourself time to heal.  Rest when you feel tired. You will be asked to do the following in order to speed your recovery:  - Cough and breathe deeply. This helps to clear and expand your lungs and can prevent pneumonia after surgery.  - [place]. Do mild physical  activity. Walking or moving your legs help your circulation and body functions return to normal. Do not try to get up or walk alone the first time after surgery.   -If you develop swelling on one leg or the other, pain in the back of your leg, redness/warmth in one of your legs, please call the office or go to the Emergency Room to have a doppler to rule out a blood clot. For shortness of breath, chest pain-seek care in the Emergency Room as soon as possible. - Actively manage your pain. Managing your pain lets you move in comfort. We will ask you to rate your pain on a scale of zero to 10. It is your responsibility to tell your doctor or nurse where and how much you hurt so your pain can be treated.  Special Considerations -If you are diabetic, you may be placed on insulin after surgery to have closer control over your blood sugars to promote healing and recovery.  This does not mean that you will be discharged on insulin.  If applicable, your oral antidiabetics will be resumed when you are tolerating a solid diet.  -Your final pathology results from surgery should be available around one week after surgery and the results will be relayed to you when available.  -Dr. Marland Kitchen is the surgeon that assists your GYN Oncologist with surgery.  If you end up staying the night, the next day after your surgery you will either see Dr.  Berline Lopes or Dr. Lahoma Crocker.  -FMLA forms can be faxed to [phone] and please allow 5-7 business days for completion.  Pain Management After Surgery -You have been prescribed your pain medication and bowel regimen medications before surgery so that you can have these available when you are discharged from the hospital. The pain medication is for use ONLY AFTER surgery and a new prescription will not be given.   -Make sure that you have Tylenol and Ibuprofen at home to use on a regular basis after surgery for pain control. We recommend alternating the medications every hour  to six hours since they work differently and are processed in the body differently for pain relief.  -Review the attached handout on narcotic use and their risks and side effects.   Bowel Regimen -You have been prescribed Sennakot-S to take nightly to prevent constipation especially if you are taking the narcotic pain medication intermittently.  It is important to prevent constipation and drink adequate amounts of liquids. You can stop taking this medication when you are not taking pain medication and you are back on your normal bowel routine.  Risks of Surgery Risks of surgery are low but include bleeding, infection, damage to surrounding structures, re-operation, blood clots, and very rarely death.   Blood Transfusion Information (For the consent to be signed before surgery)  We will be checking your blood type before surgery so in case of emergencies, we will know what type of blood you would need.                                            WHAT IS A BLOOD TRANSFUSION?  A transfusion is the replacement of blood or some of its parts. Blood is made up of multiple cells which provide different functions. Red blood cells carry oxygen and are used for blood loss replacement. White blood cells fight against infection. Platelets control bleeding. Plasma helps clot blood. Other blood products are available for specialized needs, such as hemophilia or other clotting disorders. BEFORE THE TRANSFUSION  Who gives blood for transfusions?  You may be able to donate blood to be used at a later date on yourself (autologous donation). Relatives can be asked to donate blood. This is generally not any safer than if you have received blood from a stranger. The same precautions are taken to ensure safety when a relative's blood is donated. Healthy volunteers who are fully evaluated to make sure their blood is safe. This is blood bank blood. Transfusion therapy is the safest it has ever been in the practice  of medicine. Before blood is taken from a donor, a complete history is taken to make sure that person has no history of diseases nor engages in risky social behavior (examples are intravenous drug use or sexual activity with multiple partners). The donor's travel history is screened to minimize risk of transmitting infections, such as malaria. The donated blood is tested for signs of infectious diseases, such as HIV and hepatitis. The blood is then tested to be sure it is compatible with you in order to minimize the chance of a transfusion reaction. If you or a relative donates blood, this is often done in anticipation of surgery and is not appropriate for emergency situations. It takes many days to process the donated blood. RISKS AND COMPLICATIONS Although transfusion therapy is very safe and saves many lives, the  main dangers of transfusion include:  Getting an infectious disease. Developing a transfusion reaction. This is an allergic reaction to something in the blood you were given. Every precaution is taken to prevent this. The decision to have a blood transfusion has been considered carefully by your caregiver before blood is given. Blood is not given unless the benefits outweigh the risks.  AFTER SURGERY INSTRUCTIONS  Return to work: 4-6 weeks if applicable  Activity: 1. Be up and out of the bed during the day.  Take a nap if needed.  You may walk up steps but be careful and use the hand rail.  Stair climbing will tire you more than you think, you may need to stop part way and rest.   2. No lifting or straining for 6 weeks over 10 pounds. No pushing, pulling, straining for 6 weeks.  3. No driving for *** week(s).  Do not drive if you are taking narcotic pain medicine and make sure that your reaction time has returned.   4. You can shower as soon as the next day after surgery. Shower daily.  Use your regular soap and water (not directly on the incision) and pat your incision(s) dry  afterwards; don't rub.  No tub baths or submerging your body in water until cleared by your surgeon. If you have the soap that was given to you by pre-surgical testing that was used before surgery, you do not need to use it afterwards because this can irritate your incisions.   5. No sexual activity and nothing in the vagina for *** weeks.  6. You may experience a small amount of clear drainage from your incisions, which is normal.  If the drainage persists, increases, or changes color please call the office.  7. Do not use creams, lotions, or ointments such as neosporin on your incisions after surgery until advised by your surgeon because they can cause removal of the dermabond glue on your incisions.    8. You may experience vaginal spotting after surgery or around the 6-8 week mark from surgery when the stitches at the top of the vagina begin to dissolve.  The spotting is normal but if you experience heavy bleeding, call our office.  9. Take Tylenol or ibuprofen first for pain and only use narcotic pain medication for severe pain not relieved by the Tylenol or Ibuprofen.  Monitor your Tylenol intake to a max of 4,000 mg in a 24 hour period. You can alternate these medications after surgery.  Diet: 1. Low sodium Heart Healthy Diet is recommended but you are cleared to resume your normal (before surgery) diet after your procedure.  2. It is safe to use a laxative, such as Miralax or Colace, if you have difficulty moving your bowels. You have been prescribed Sennakot-S to take at bedtime every evening after surgery to keep bowel movements regular and to prevent constipation.    Wound Care: 1. Keep clean and dry.  Shower daily.  Reasons to call the Doctor: Fever - Oral temperature greater than 100.4 degrees Fahrenheit Foul-smelling vaginal discharge Difficulty urinating Nausea and vomiting Increased pain at the site of the incision that is unrelieved with pain medicine. Difficulty breathing  with or without chest pain New calf pain especially if only on one side Sudden, continuing increased vaginal bleeding with or without clots.   Contacts: For questions or concerns you should contact:  Dr. Marland Kitchen at [place], NP at [phone]  After Hours: call [phone] and have the  GYN Oncologist paged/contacted (after 5 pm or on the weekends).  Messages sent via mychart are for non-urgent matters and are not responded to after hours so for urgent needs, please call the after hours number.

## 2021-08-30 NOTE — Patient Instructions (Addendum)
DUE TO COVID-19 ONLY ONE VISITOR IS ALLOWED TO COME WITH YOU AND STAY IN THE WAITING ROOM ONLY DURING PRE OP AND PROCEDURE.   **NO VISITORS ARE ALLOWED IN THE SHORT STAY AREA OR RECOVERY ROOM!!**       Your procedure is scheduled on: 09/06/21   Report to [facility] Main Entrance    Report to admitting at 5:15 AM   Call this number if you have problems the morning of surgery [phone]   Do not eat food :After Midnight.   May have liquids until 4:30 AM day of surgery  CLEAR LIQUID DIET  Foods Allowed                                                                     Foods Excluded  Water, Black Coffee and tea (no milk or creamer)           liquids that you cannot  Plain Jell-O in any flavor  (No red)                                    see through such as: Fruit ices (not with fruit pulp)                                            milk, soups, orange juice              Iced Popsicles (No red)                                                All solid food                                   Apple juices Sports drinks like Gatorade (No red) Lightly seasoned clear broth or consume(fat free) Sugar   Drink 2 G2 drinks the night before surgery, have finished by 10pm.  Complete one G2 drink the morning of surgery 3 hours prior to scheduled surgery, have finished by 4:30 AM.     The day of surgery:  Drink ONE (1) Pre-Surgery G2 by 4:30 am the morning of surgery. Drink in one sitting. Do not sip.  This drink was given to you during your hospital  pre-op appointment visit. Nothing else to drink after completing the  Pre-Surgery Clear Ensure or G2.          If you have questions, please contact your surgeon's office.     Oral Hygiene is also important to reduce your risk of infection.                                    Remember - BRUSH YOUR TEETH THE MORNING OF SURGERY WITH YOUR REGULAR TOOTHPASTE   Take these medicines the morning of surgery with  A SIP OF WATER: synthroid                               You may not have any metal on your body including hair pins, jewelry, and body piercing             Do not wear make-up, lotions, powders, perfumes, or deodorant  Do not wear nail polish including gel and S&S, artificial/acrylic nails, or any other type of covering on natural nails including finger and toenails. If you have artificial nails, gel coating, etc. that needs to be removed by a nail salon please have this removed prior to surgery or surgery may need to be canceled/ delayed if the surgeon/ anesthesia feels like they are unable to be safely monitored.   Do not shave  48 hours prior to surgery.    Do not bring valuables to the hospital. [place].   Patients discharged on the day of surgery will not be allowed to drive home.              Please read over the following fact sheets you were given: IF YOU HAVE QUESTIONS ABOUT YOUR PRE-OP INSTRUCTIONS PLEASE CALL [facility] - Preparing for Surgery Before surgery, you can play an important role.  Because skin is not sterile, your skin needs to be as free of germs as possible.  You can reduce the number of germs on your skin by washing with CHG (chlorahexidine gluconate) soap before surgery.  CHG is an antiseptic cleaner which kills germs and bonds with the skin to continue killing germs even after washing. Please DO NOT use if you have an allergy to CHG or antibacterial soaps.  If your skin becomes reddened/irritated stop using the CHG and inform your nurse when you arrive at Short Stay. Do not shave (including legs and underarms) for at least 48 hours prior to the first CHG shower.  You may shave your face/neck.  Please follow these instructions carefully:  1.  Shower with CHG Soap the night before surgery and the  morning of surgery.  2.  If you choose to wash your hair, wash your hair first as usual with your normal  shampoo.  3.  After  you shampoo, rinse your hair and body thoroughly to remove the shampoo.                             4.  Use CHG as you would any other liquid soap.  You can apply chg directly to the skin and wash.  Gently with a scrungie or clean washcloth.  5.  Apply the CHG Soap to your body ONLY FROM THE NECK DOWN.   Do   not use on face/ open                           Wound or open sores. Avoid contact with eyes, ears mouth and   genitals (private parts).                       Wash face,  Genitals (private parts) with your normal soap.             6.  Wash  thoroughly, paying special attention to the area where your    surgery  will be performed.  7.  Thoroughly rinse your body with warm water from the neck down.  8.  DO NOT shower/wash with your normal soap after using and rinsing off the CHG Soap.                9.  Pat yourself dry with a clean towel.            10.  Wear clean pajamas.            11.  Place clean sheets on your bed the night of your first shower and do not  sleep with pets. Day of Surgery : Do not apply any lotions/deodorants the morning of surgery.  Please wear clean clothes to the hospital/surgery center.  FAILURE TO FOLLOW THESE INSTRUCTIONS MAY RESULT IN THE CANCELLATION OF YOUR SURGERY  PATIENT SIGNATURE_________________________________  NURSE SIGNATURE__________________________________  ________________________________________________________________________    WHAT IS A BLOOD TRANSFUSION? Blood Transfusion Information  A transfusion is the replacement of blood or some of its parts. Blood is made up of multiple cells which provide different functions. Red blood cells carry oxygen and are used for blood loss replacement. White blood cells fight against infection. Platelets control bleeding. Plasma helps clot blood. Other blood products are available for specialized needs, such as hemophilia or other clotting disorders. BEFORE THE TRANSFUSION  Who gives blood for  transfusions?  Healthy volunteers who are fully evaluated to make sure their blood is safe. This is blood bank blood. Transfusion therapy is the safest it has ever been in the practice of medicine. Before blood is taken from a donor, a complete history is taken to make sure that person has no history of diseases nor engages in risky social behavior (examples are intravenous drug use or sexual activity with multiple partners). The donor's travel history is screened to minimize risk of transmitting infections, such as malaria. The donated blood is tested for signs of infectious diseases, such as HIV and hepatitis. The blood is then tested to be sure it is compatible with you in order to minimize the chance of a transfusion reaction. If you or a relative donates blood, this is often done in anticipation of surgery and is not appropriate for emergency situations. It takes many days to process the donated blood. RISKS AND COMPLICATIONS Although transfusion therapy is very safe and saves many lives, the main dangers of transfusion include:  Getting an infectious disease. Developing a transfusion reaction. This is an allergic reaction to something in the blood you were given. Every precaution is taken to prevent this. The decision to have a blood transfusion has been considered carefully by your caregiver before blood is given. Blood is not given unless the benefits outweigh the risks. AFTER THE TRANSFUSION Right after receiving a blood transfusion, you will usually feel much better and more energetic. This is especially true if your red blood cells have gotten low (anemic). The transfusion raises the level of the red blood cells which carry oxygen, and this usually causes an energy increase. The nurse administering the transfusion will monitor you carefully for complications. HOME CARE INSTRUCTIONS  No special instructions are needed after a transfusion. You may find your energy is better. Speak with your  caregiver about any limitations on activity for underlying diseases you may have. SEEK MEDICAL CARE IF:  Your condition is not improving after your transfusion. You develop redness or irritation at the intravenous (  IV) site. SEEK IMMEDIATE MEDICAL CARE IF:  Any of the following symptoms occur over the next 12 hours: Shaking chills. You have a temperature by mouth above 102 F (38.9 C), not controlled by medicine. Chest, back, or muscle pain. People around you feel you are not acting correctly or are confused. Shortness of breath or difficulty breathing. Dizziness and fainting. You get a rash or develop hives. You have a decrease in urine output. Your urine turns a dark color or changes to pink, red, or brown. Any of the following symptoms occur over the next 10 days: You have a temperature by mouth above 102 F (38.9 C), not controlled by medicine. Shortness of breath. Weakness after normal activity. The white part of the eye turns yellow (jaundice). You have a decrease in the amount of urine or are urinating less often. Your urine turns a dark color or changes to pink, red, or brown. Document Released: 09/13/2000 Document Revised: 12/09/2011 Document Reviewed: 05/02/2008 [employer] Patient Information 2014 [place], [place].  _______________________________________________________________________

## 2021-08-30 NOTE — Progress Notes (Deleted)
Patient here for a pre-operative appointment prior to her scheduled surgery on October 08, 2022. She is scheduled for robotic assisted laparoscopic unilateral salpingectomy versus unilateral salpingo-oophorectomy, possible bilateral salpingo-oophorectomy, possible total hysterectomy, possible staging. She has her pre-admission testing appointment on 10/07/22 at [facility].  The surgery was discussed in detail.  See after visit summary for additional details. Visual aids used to discuss items related to surgery including sequential compression stockings, foley catheter, IV pump, multi-modal pain regimen including tylenol, photo of the surgical robot, female reproductive system to discuss surgery in detail.      Discussed post-op pain management in detail including the aspects of the enhanced recovery pathway.  Advised her that a new prescription would be sent in for tramadol and it is only to be used for after her upcoming surgery.  We discussed the use of tylenol post-op and to monitor for a maximum of 4,000 mg in a 24 hour period.  Also prescribed sennakot to be used after surgery and to hold if having loose stools.  Discussed bowel regimen in detail.     Discussed the use of SCDs and measures to take at home to prevent DVT including frequent mobility.  Reportable signs and symptoms of DVT discussed. Post-operative instructions discussed and expectations for after surgery. Incisional care discussed as well including reportable signs and symptoms including erythema, drainage, wound separation.     10 minutes spent with the patient and preparing information.  Verbalizing understanding of material discussed. No needs or concerns voiced at the end of the visit.   Advised patient to call for any needs.  Advised that her post-operative medications had been prescribed and could be picked up at any time.    This appointment is included in the global surgical bundle as pre-operative teaching and has no charge.    

## 2021-08-30 NOTE — Progress Notes (Addendum)
COVID swab appointment: n/a  COVID Vaccine Completed: yes x2 Date COVID Vaccine completed: Has received booster: COVID vaccine manufacturer: [employer]   Date of COVID positive in last 90 days: no  PCP - [facility] Primary [facility] Cardiologist - n/a  Chest x-ray - n/a EKG - this year, will request Stress Test - n/a ECHO - n/a Cardiac Cath - n/a Pacemaker/ICD device last checked: n/a Spinal Cord Stimulator: n/a  Sleep Study - n/a CPAP -   Fasting Blood Sugar - pre DM per patient, Doesn't check at home Checks Blood Sugar _____ times a day  Blood Thinner Instructions: Aspirin Instructions: n/a Last Dose:  Activity level: Can go up a flight of stairs and perform activities of daily living without stopping and without symptoms of chest pain or shortness of breath.    Anesthesia review: UA positive for leukocyte and bacteria, DM  Patient denies shortness of breath, fever, cough and chest pain at PAT appointment   Patient verbalized understanding of instructions that were given to them at the PAT appointment. Patient was also instructed that they will need to review over the PAT instructions again at home before surgery.

## 2021-08-30 NOTE — Telephone Encounter (Signed)
Spoke with Alyssa Oliver, pre-op appointment with Alyssa John, NP rescheduled to Monday 09/03/21 at 0830 before her pre-admission testing appointment. Patient is agreement of date and time of appointment.

## 2021-08-31 ENCOUNTER — Encounter: Payer: No Typology Code available for payment source | Admitting: Gynecologic Oncology

## 2021-08-31 NOTE — Patient Instructions (Addendum)
Preparing for your Surgery  Plan for surgery on September 06, 2021 with Dr. Jeral Pinch at [place] will be scheduled for robotic assisted total laparoscopic hysterectomy (removal of the uterus and cervix), bilateral salpingectomy (removal of both fallopian tubes), possible unilateral oophorectomy (removal of one ovary), mini laparotomy for specimen delivery, possible staging if a cancer is identified, possible use of cell saver.   Pre-operative Testing -(DONE) You will receive a phone call from presurgical testing at [facility] to arrange for a pre-operative appointment and lab work.  -Bring your insurance card, copy of an advanced directive if applicable, medication list  -At that visit, you will be asked to sign a consent for a possible blood transfusion in case a transfusion becomes necessary during surgery.  The need for a blood transfusion is rare but having consent is a necessary part of your care.     -You should not be taking blood thinners or aspirin at least ten days prior to surgery unless instructed by your surgeon.  -Do not take supplements such as fish oil (omega 3), red yeast rice, turmeric before your surgery. You want to avoid medications with aspirin in them including headache powders such as BC or Goody's), Excedrin migraine.  Day Before Surgery at [place] will be asked to take in a light diet the day before surgery. You will be advised you can have clear liquids up until 3 hours before your surgery.    Eat a light diet the day before surgery.  Examples including soups, broths, toast, yogurt, mashed potatoes.  AVOID GAS PRODUCING FOODS. Things to avoid include carbonated beverages (fizzy beverages, sodas), raw fruits and raw vegetables (uncooked), or beans.   If your bowels are filled with gas, your surgeon will have difficulty visualizing your pelvic organs which increases your surgical risks.  Your role in recovery Your role is to become  active as soon as directed by your doctor, while still giving yourself time to heal.  Rest when you feel tired. You will be asked to do the following in order to speed your recovery:  - Cough and breathe deeply. This helps to clear and expand your lungs and can prevent pneumonia after surgery.  - [place]. Do mild physical activity. Walking or moving your legs help your circulation and body functions return to normal. Do not try to get up or walk alone the first time after surgery.   -If you develop swelling on one leg or the other, pain in the back of your leg, redness/warmth in one of your legs, please call the office or go to the Emergency Room to have a doppler to rule out a blood clot. For shortness of breath, chest pain-seek care in the Emergency Room as soon as possible. - Actively manage your pain. Managing your pain lets you move in comfort. We will ask you to rate your pain on a scale of zero to 10. It is your responsibility to tell your doctor or nurse where and how much you hurt so your pain can be treated.  Special Considerations -If you are diabetic, you may be placed on insulin after surgery to have closer control over your blood sugars to promote healing and recovery.  This does not mean that you will be discharged on insulin.  If applicable, your oral antidiabetics will be resumed when you are tolerating a solid diet.  -Your final pathology results from surgery should be available around one week  after surgery and the results will be relayed to you when available.  -Dr. Lahoma Crocker is the surgeon that assists your GYN Oncologist with surgery.  If you end up staying the night, the next day after your surgery you will either see Dr. Berline Lopes or Dr. Lahoma Crocker.  -FMLA forms can be faxed to [phone] and please allow 5-7 business days for completion.  Pain Management After Surgery -You have been prescribed your pain medication (oxycodone) and bowel  regimen medications before surgery so that you can have these available when you are discharged from the hospital. The pain medication is for use ONLY AFTER surgery and a new prescription will not be given.   -Make sure that you have Tylenol and Ibuprofen at home to use on a regular basis after surgery for pain control. We recommend alternating the medications every hour to six hours since they work differently and are processed in the body differently for pain relief.  -Review the attached handout on narcotic use and their risks and side effects.   Bowel Regimen -You have been prescribed Sennakot-S to take nightly to prevent constipation especially if you are taking the narcotic pain medication intermittently.  It is important to prevent constipation and drink adequate amounts of liquids. You can stop taking this medication when you are not taking pain medication and you are back on your normal bowel routine.  Risks of Surgery Risks of surgery are low but include bleeding, infection, damage to surrounding structures, re-operation, blood clots, and very rarely death.   Blood Transfusion Information (For the consent to be signed before surgery)  We will be checking your blood type before surgery so in case of emergencies, we will know what type of blood you would need.                                            WHAT IS A BLOOD TRANSFUSION?  A transfusion is the replacement of blood or some of its parts. Blood is made up of multiple cells which provide different functions. Red blood cells carry oxygen and are used for blood loss replacement. White blood cells fight against infection. Platelets control bleeding. Plasma helps clot blood. Other blood products are available for specialized needs, such as hemophilia or other clotting disorders. BEFORE THE TRANSFUSION  Who gives blood for transfusions?  You may be able to donate blood to be used at a later date on yourself (autologous  donation). Relatives can be asked to donate blood. This is generally not any safer than if you have received blood from a stranger. The same precautions are taken to ensure safety when a relative's blood is donated. Healthy volunteers who are fully evaluated to make sure their blood is safe. This is blood bank blood. Transfusion therapy is the safest it has ever been in the practice of medicine. Before blood is taken from a donor, a complete history is taken to make sure that person has no history of diseases nor engages in risky social behavior (examples are intravenous drug use or sexual activity with multiple partners). The donor's travel history is screened to minimize risk of transmitting infections, such as malaria. The donated blood is tested for signs of infectious diseases, such as HIV and hepatitis. The blood is then tested to be sure it is compatible with you in order to minimize the chance of a transfusion  reaction. If you or a relative donates blood, this is often done in anticipation of surgery and is not appropriate for emergency situations. It takes many days to process the donated blood. RISKS AND COMPLICATIONS Although transfusion therapy is very safe and saves many lives, the main dangers of transfusion include:  Getting an infectious disease. Developing a transfusion reaction. This is an allergic reaction to something in the blood you were given. Every precaution is taken to prevent this. The decision to have a blood transfusion has been considered carefully by your caregiver before blood is given. Blood is not given unless the benefits outweigh the risks.  AFTER SURGERY INSTRUCTIONS  Return to work: 4-6 weeks if applicable  You will have a white honeycomb dressing over your larger incision. This dressing can be removed 5 days after surgery and you do not need to reapply a new dressing. Once you remove the dressing, you will notice that you have the surgical glue (dermabond) on the  incision and this will peel off on its own. You can get this dressing wet in the shower the days after surgery prior to removal on the 5th day.   Activity: 1. Be up and out of the bed during the day.  Take a nap if needed.  You may walk up steps but be careful and use the hand rail.  Stair climbing will tire you more than you think, you may need to stop part way and rest.   2. No lifting or straining for 6 weeks over 10 pounds. No pushing, pulling, straining for 6 weeks.  3. No driving for 1 week(s).  Do not drive if you are taking narcotic pain medicine and make sure that your reaction time has returned.   4. You can shower as soon as the next day after surgery. Shower daily.  Use your regular soap and water (not directly on the incision) and pat your incision(s) dry afterwards; don't rub.  No tub baths or submerging your body in water until cleared by your surgeon. If you have the soap that was given to you by pre-surgical testing that was used before surgery, you do not need to use it afterwards because this can irritate your incisions.   5. No sexual activity and nothing in the vagina for 8 weeks.  6. You may experience a small amount of clear drainage from your incisions, which is normal.  If the drainage persists, increases, or changes color please call the office.  7. Do not use creams, lotions, or ointments such as neosporin on your incisions after surgery until advised by your surgeon because they can cause removal of the dermabond glue on your incisions.    8. You may experience vaginal spotting after surgery or around the 6-8 week mark from surgery when the stitches at the top of the vagina begin to dissolve.  The spotting is normal but if you experience heavy bleeding, call our office.  9. Take Tylenol or ibuprofen first for pain and only use narcotic pain medication for severe pain not relieved by the Tylenol or Ibuprofen.  Monitor your Tylenol intake to a max of 4,000 mg in a 24 hour  period. You can alternate these medications after surgery.  Diet: 1. Low sodium Heart Healthy Diet is recommended but you are cleared to resume your normal (before surgery) diet after your procedure.  2. It is safe to use a laxative, such as Miralax or Colace, if you have difficulty moving your bowels. You have  been prescribed Sennakot-S to take at bedtime every evening after surgery to keep bowel movements regular and to prevent constipation.    Wound Care: 1. Keep clean and dry.  Shower daily.  Reasons to call the Doctor: Fever - Oral temperature greater than 100.4 degrees Fahrenheit Foul-smelling vaginal discharge Difficulty urinating Nausea and vomiting Increased pain at the site of the incision that is unrelieved with pain medicine. Difficulty breathing with or without chest pain New calf pain especially if only on one side Sudden, continuing increased vaginal bleeding with or without clots.   Contacts: For questions or concerns you should contact:  Dr. Jeral Pinch at [phone]  [name], NP at [phone]  After Hours: call [phone] and have the GYN Oncologist paged/contacted (after 5 pm or on the weekends).  Messages sent via mychart are for non-urgent matters and are not responded to after hours so for urgent needs, please call the after hours number.

## 2021-08-31 NOTE — Progress Notes (Signed)
Patient here for a pre-operative appointment prior to her scheduled surgery on September 06, 2021. She is scheduled for robotic assisted total laparoscopic hysterectomy, bilateral salpingectomy, possible unilateral oophorectomy, mini laparotomy for specimen delivery, possible staging if a cancer is identified, possible use of cell saver.  She has her pre-admission testing appointment this am at [facility].  The surgery was discussed in detail.  See after visit summary for additional details. Visual aids used to discuss items related to surgery including sequential compression stockings, foley catheter, IV pump, multi-modal pain regimen including tylenol, photo of the surgical robot, female reproductive system to discuss surgery in detail.      Discussed post-op pain management in detail including the aspects of the enhanced recovery pathway.  Advised her that a new prescription would be sent in for oxycodone and it is only to be used for after her upcoming surgery.  We discussed the use of tylenol post-op and to monitor for a maximum of 4,000 mg in a 24 hour period.  Also prescribed sennakot-S to be used after surgery and to hold if having loose stools.  Discussed bowel regimen in detail.     Discussed the use of SCDs and measures to take at home to prevent DVT including frequent mobility.  Reportable signs and symptoms of DVT discussed. Post-operative instructions discussed and expectations for after surgery. Incisional care discussed as well including reportable signs and symptoms including erythema, drainage, wound separation.   Due to persistent left lower extremity edema and bilateral lower calf intermittent pains, plan to order a bilateral LE doppler to rule out DVT prior to proceeding with surgery. She wears compression stockings. Also discussed the possible use of the cell saver if needed.    10 minutes spent with the patient.  Verbalizing understanding of material discussed. No needs or concerns  voiced at the end of the visit. Advised patient to call for any needs.  Advised that her post-operative medications had been prescribed and could be picked up at any time.    This appointment is included in the global surgical bundle as pre-operative teaching and has no charge.

## 2021-09-03 ENCOUNTER — Encounter (HOSPITAL_COMMUNITY): Payer: Self-pay

## 2021-09-03 ENCOUNTER — Ambulatory Visit (HOSPITAL_COMMUNITY)
Admission: RE | Admit: 2021-09-03 | Discharge: 2021-09-03 | Disposition: A | Discharge status: Home / Self Care | Payer: No Typology Code available for payment source | Source: Ambulatory Visit | Attending: Gynecologic Oncology | Admitting: Gynecologic Oncology

## 2021-09-03 ENCOUNTER — Other Ambulatory Visit: Payer: Self-pay | Admitting: Gynecologic Oncology

## 2021-09-03 ENCOUNTER — Telehealth: Payer: Self-pay

## 2021-09-03 ENCOUNTER — Inpatient Hospital Stay: Payer: No Typology Code available for payment source | Attending: Gynecologic Oncology | Admitting: Gynecologic Oncology

## 2021-09-03 ENCOUNTER — Encounter (HOSPITAL_COMMUNITY)
Admission: RE | Admit: 2021-09-03 | Discharge: 2021-09-03 | Disposition: A | Discharge status: Home / Self Care | Payer: No Typology Code available for payment source | Source: Ambulatory Visit | Attending: Gynecologic Oncology | Admitting: Gynecologic Oncology

## 2021-09-03 ENCOUNTER — Other Ambulatory Visit: Payer: Self-pay

## 2021-09-03 VITALS — BP 150/89 | HR 77 | Temp 98.5°F | Resp 18 | Ht 65.0 in | Wt 241.4 lb

## 2021-09-03 VITALS — BP 141/62 | HR 80 | Temp 98.4°F | Resp 16 | Ht 65.0 in | Wt 245.0 lb

## 2021-09-03 DIAGNOSIS — N9489 Other specified conditions associated with female genital organs and menstrual cycle: Secondary | ICD-10-CM | POA: Insufficient documentation

## 2021-09-03 DIAGNOSIS — E119 Type 2 diabetes mellitus without complications: Secondary | ICD-10-CM

## 2021-09-03 DIAGNOSIS — D259 Leiomyoma of uterus, unspecified: Secondary | ICD-10-CM

## 2021-09-03 DIAGNOSIS — E876 Hypokalemia: Secondary | ICD-10-CM

## 2021-09-03 DIAGNOSIS — N939 Abnormal uterine and vaginal bleeding, unspecified: Secondary | ICD-10-CM | POA: Insufficient documentation

## 2021-09-03 DIAGNOSIS — R600 Localized edema: Secondary | ICD-10-CM

## 2021-09-03 DIAGNOSIS — Z01812 Encounter for preprocedural laboratory examination: Secondary | ICD-10-CM | POA: Insufficient documentation

## 2021-09-03 HISTORY — DX: Headache, unspecified: R51.9

## 2021-09-03 HISTORY — DX: Essential (primary) hypertension: I10

## 2021-09-03 LAB — URINALYSIS, ROUTINE W REFLEX MICROSCOPIC
Bilirubin Urine: NEGATIVE
Glucose, UA: NEGATIVE mg/dL
Hgb urine dipstick: NEGATIVE
Ketones, ur: NEGATIVE mg/dL
Nitrite: NEGATIVE
Protein, ur: NEGATIVE mg/dL
Specific Gravity, Urine: 1.019 (ref 1.005–1.030)
pH: 8 (ref 5.0–8.0)

## 2021-09-03 LAB — CBC
HCT: 37.8 % (ref 36.0–46.0)
Hemoglobin: 12.2 g/dL (ref 12.0–15.0)
MCH: 28.9 pg (ref 26.0–34.0)
MCHC: 32.3 g/dL (ref 30.0–36.0)
MCV: 89.6 fL (ref 80.0–100.0)
Platelets: 423 10*3/uL — ABNORMAL HIGH (ref 150–400)
RBC: 4.22 MIL/uL (ref 3.87–5.11)
RDW: 15.6 % — ABNORMAL HIGH (ref 11.5–15.5)
WBC: 6.3 10*3/uL (ref 4.0–10.5)
nRBC: 0 % (ref 0.0–0.2)

## 2021-09-03 LAB — HEMOGLOBIN A1C
Hgb A1c MFr Bld: 5.9 % — ABNORMAL HIGH (ref 4.8–5.6)
Mean Plasma Glucose: 122.63 mg/dL

## 2021-09-03 LAB — COMPREHENSIVE METABOLIC PANEL
ALT: 15 U/L (ref 0–44)
AST: 17 U/L (ref 15–41)
Albumin: 4 g/dL (ref 3.5–5.0)
Alkaline Phosphatase: 62 U/L (ref 38–126)
Anion gap: 4 — ABNORMAL LOW (ref 5–15)
BUN: 14 mg/dL (ref 6–20)
CO2: 28 mmol/L (ref 22–32)
Calcium: 8.8 mg/dL — ABNORMAL LOW (ref 8.9–10.3)
Chloride: 102 mmol/L (ref 98–111)
Creatinine, Ser: 0.61 mg/dL (ref 0.44–1.00)
GFR, Estimated: >60 mL/min (ref >60)
Glucose, Bld: 110 mg/dL — ABNORMAL HIGH (ref 70–99)
Potassium: 3.2 mmol/L — ABNORMAL LOW (ref 3.5–5.1)
Sodium: 134 mmol/L — ABNORMAL LOW (ref 135–145)
Total Bilirubin: 0.2 mg/dL — ABNORMAL LOW (ref 0.3–1.2)
Total Protein: 7.5 g/dL (ref 6.5–8.1)

## 2021-09-03 LAB — GLUCOSE, CAPILLARY: Glucose-Capillary: 137 mg/dL — ABNORMAL HIGH (ref 70–99)

## 2021-09-03 LAB — PREGNANCY, URINE: Preg Test, Ur: NEGATIVE

## 2021-09-03 MED ORDER — OXYCODONE HCL 5 MG PO TABS: 5.0000 mg | ORAL_TABLET | ORAL | 0 refills | Status: DC | PRN

## 2021-09-03 MED ORDER — IBUPROFEN 800 MG PO TABS: 800.0000 mg | ORAL_TABLET | Freq: Three times a day (TID) | ORAL | 0 refills | Status: DC | PRN

## 2021-09-03 MED ORDER — SENNOSIDES-DOCUSATE SODIUM 8.6-50 MG PO TABS
2.0000 | ORAL_TABLET | Freq: Every day | ORAL | 0 refills | Status: DC
Start: 2021-09-03 — End: 2022-08-18

## 2021-09-03 MED ORDER — POTASSIUM CHLORIDE CRYS ER 20 MEQ PO TBCR: 20.0000 meq | EXTENDED_RELEASE_TABLET | Freq: Two times a day (BID) | ORAL | 0 refills | Status: DC

## 2021-09-03 NOTE — Progress Notes (Signed)
UA positive for leukocyte and bacteria. Results routed to Dr. Berline Lopes

## 2021-09-03 NOTE — Progress Notes (Signed)
Bilateral lower extremity venous duplex has been completed. Preliminary results can be found in CV Proc through chart review.  Results were given to [name] NP.  09/03/21 1:08 PM Carlos Levering RVT

## 2021-09-03 NOTE — Telephone Encounter (Signed)
Spoke with Alyssa Oliver regarding her CMP, Doppler and UA results. Per [name], NP on final report doppler is negative for clot. Patient can continue wearing her compression socks during the day and have them off at night. Potassium is low at 3.2, 2 K+ tablets have been sent into patients pharmacy. Patient can take one tonight and one in the morning. Or she can take one in the morning and one tomorrow afternoon/evening. UA showed rare amount of bacteria and moderate leukocytes. Patient denies urinary urgency, burning, odor or pain. She had some frequency but believes this is from nerves. She states Dr. Diamond Nickel sent in a prescription for metronidazole for a vaginal infection which she has completed. Instructed patient that her urine will be sent for culture and our office will call her with the results. Patient verbalized understanding and will call with any questions or concerns.

## 2021-09-04 DIAGNOSIS — R600 Localized edema: Secondary | ICD-10-CM | POA: Insufficient documentation

## 2021-09-05 ENCOUNTER — Telehealth: Payer: Self-pay

## 2021-09-05 ENCOUNTER — Encounter: Payer: Self-pay | Admitting: Gynecologic Oncology

## 2021-09-05 LAB — URINE CULTURE

## 2021-09-05 NOTE — Anesthesia Preprocedure Evaluation (Addendum)
Anesthesia Evaluation  Patient identified by MRN, date of birth, ID band Patient awake    Reviewed: Allergy & Precautions, H&P , NPO status , Patient's Chart, lab work & pertinent test results  Airway Mallampati: II  TM Distance: >3 FB Neck ROM: Full    Dental no notable dental hx.    Pulmonary neg pulmonary ROS,    Pulmonary exam normal breath sounds clear to auscultation       Cardiovascular hypertension, Normal cardiovascular exam Rhythm:Regular Rate:Normal     Neuro/Psych negative neurological ROS  negative psych ROS   GI/Hepatic negative GI ROS, Neg liver ROS,   Endo/Other  diabetesMorbid obesity  Renal/GU negative Renal ROS  negative genitourinary   Musculoskeletal negative musculoskeletal ROS (+)   Abdominal   Peds negative pediatric ROS (+)  Hematology negative hematology ROS (+)   Anesthesia Other Findings   Reproductive/Obstetrics negative OB ROS                            Anesthesia Physical Anesthesia Plan  ASA: 2  Anesthesia Plan: General   Post-op Pain Management: Tylenol PO (pre-op)   Induction: Intravenous  PONV Risk Score and Plan: 3 and Ondansetron, Dexamethasone, Midazolam and Treatment may vary due to age or medical condition  Airway Management Planned: Oral ETT  Additional Equipment:   Intra-op Plan:   Post-operative Plan: Extubation in OR  Informed Consent: I have reviewed the patients History and Physical, chart, labs and discussed the procedure including the risks, benefits and alternatives for the proposed anesthesia with the patient or authorized representative who has indicated his/her understanding and acceptance.     Dental advisory given  Plan Discussed with: CRNA and Surgeon  Anesthesia Plan Comments:         Anesthesia Quick Evaluation

## 2021-09-05 NOTE — Patient Instructions (Addendum)
[facility] notified of possible need for heparin for possible cell saver use during patient's surgery. Per Dr. Berline Lopes, the heparin 30,000 units did not need to be pre-prepared but we wanted pharmacy to be aware in case of a stat need.

## 2021-09-05 NOTE — Telephone Encounter (Signed)
Telephone call to check on pre-operative status. Patient compliant with pre-operative instructions.  Reinforced Nothing to eat after midnight and clear liquids until 0430.  Reviewed urine culture results. Per [name], NP Urine most likely contaminated with collection, no need to repeat. Patient verbalized understanding. No questions or concerns voiced.  Instructed to call for any needs.

## 2021-09-06 ENCOUNTER — Encounter (HOSPITAL_COMMUNITY): Payer: Self-pay | Admitting: Gynecologic Oncology

## 2021-09-06 ENCOUNTER — Ambulatory Visit (HOSPITAL_COMMUNITY): Payer: No Typology Code available for payment source | Admitting: Anesthesiology

## 2021-09-06 ENCOUNTER — Encounter (HOSPITAL_COMMUNITY)
Admission: RE | Disposition: A | Discharge status: Home / Self Care | Payer: Self-pay | Source: Home / Self Care | Attending: Gynecologic Oncology

## 2021-09-06 ENCOUNTER — Ambulatory Visit (HOSPITAL_COMMUNITY): Payer: No Typology Code available for payment source | Admitting: Physician Assistant

## 2021-09-06 ENCOUNTER — Ambulatory Visit (HOSPITAL_COMMUNITY)
Admission: RE | Admit: 2021-09-06 | Discharge: 2021-09-06 | Disposition: A | Discharge status: Home / Self Care | Payer: No Typology Code available for payment source | Attending: Gynecologic Oncology | Admitting: Gynecologic Oncology

## 2021-09-06 DIAGNOSIS — Z6841 Body Mass Index (BMI) 40.0 and over, adult: Secondary | ICD-10-CM | POA: Insufficient documentation

## 2021-09-06 DIAGNOSIS — N939 Abnormal uterine and vaginal bleeding, unspecified: Secondary | ICD-10-CM

## 2021-09-06 DIAGNOSIS — N9489 Other specified conditions associated with female genital organs and menstrual cycle: Secondary | ICD-10-CM

## 2021-09-06 DIAGNOSIS — D250 Submucous leiomyoma of uterus: Secondary | ICD-10-CM | POA: Insufficient documentation

## 2021-09-06 DIAGNOSIS — N8003 Adenomyosis of the uterus: Secondary | ICD-10-CM | POA: Insufficient documentation

## 2021-09-06 DIAGNOSIS — I10 Essential (primary) hypertension: Secondary | ICD-10-CM | POA: Diagnosis not present

## 2021-09-06 DIAGNOSIS — D259 Leiomyoma of uterus, unspecified: Secondary | ICD-10-CM

## 2021-09-06 HISTORY — PX: ROBOTIC ASSISTED LAPAROSCOPIC HYSTERECTOMY AND SALPINGECTOMY: SHX6379

## 2021-09-06 LAB — TYPE AND SCREEN
ABO/RH(D): O POS
Antibody Screen: NEGATIVE

## 2021-09-06 LAB — GLUCOSE, CAPILLARY
Glucose-Capillary: 101 mg/dL — ABNORMAL HIGH (ref 70–99)
Glucose-Capillary: 130 mg/dL — ABNORMAL HIGH (ref 70–99)

## 2021-09-06 LAB — ABO/RH: ABO/RH(D): O POS

## 2021-09-06 SURGERY — XI ROBOTIC ASSISTED LAPAROSCOPIC HYSTERECTOMY AND SALPINGECTOMY
Anesthesia: General

## 2021-09-06 MED ORDER — ONDANSETRON HCL 4 MG/2ML IJ SOLN
INTRAMUSCULAR | Status: DC | PRN
  Administered 2021-09-06: 4 mg via INTRAVENOUS

## 2021-09-06 MED ORDER — ROCURONIUM BROMIDE 100 MG/10ML IV SOLN
INTRAVENOUS | Status: DC | PRN
  Administered 2021-09-06: 80 mg via INTRAVENOUS
  Administered 2021-09-06: 20 mg via INTRAVENOUS

## 2021-09-06 MED ORDER — BUPIVACAINE HCL 0.25 % IJ SOLN
INTRAMUSCULAR | Status: DC | PRN
  Administered 2021-09-06: 34 mL

## 2021-09-06 MED ORDER — ENSURE PRE-SURGERY PO LIQD
592.0000 mL | Freq: Once | ORAL | Status: DC
  Filled 2021-09-06: qty 592

## 2021-09-06 MED ORDER — FENTANYL CITRATE (PF) 100 MCG/2ML IJ SOLN
INTRAMUSCULAR | Status: AC
  Filled 2021-09-06: qty 2

## 2021-09-06 MED ORDER — LIDOCAINE HCL (PF) 2 % IJ SOLN
INTRAMUSCULAR | Status: AC
  Filled 2021-09-06: qty 5

## 2021-09-06 MED ORDER — ORAL CARE MOUTH RINSE: 15.0000 mL | Freq: Once | OROMUCOSAL | Status: AC

## 2021-09-06 MED ORDER — MIDAZOLAM HCL 5 MG/5ML IJ SOLN
INTRAMUSCULAR | Status: DC | PRN
  Administered 2021-09-06: 2 mg via INTRAVENOUS

## 2021-09-06 MED ORDER — ONDANSETRON HCL 4 MG/2ML IJ SOLN
INTRAMUSCULAR | Status: AC
  Filled 2021-09-06: qty 2

## 2021-09-06 MED ORDER — SODIUM CHLORIDE (PF) 0.9 % IJ SOLN
INTRAMUSCULAR | Status: AC
  Filled 2021-09-06: qty 20

## 2021-09-06 MED ORDER — LACTATED RINGERS IV SOLN: INTRAVENOUS | Status: DC

## 2021-09-06 MED ORDER — PROPOFOL 10 MG/ML IV BOLUS
INTRAVENOUS | Status: DC | PRN
  Administered 2021-09-06: 200 mg via INTRAVENOUS

## 2021-09-06 MED ORDER — DEXAMETHASONE SODIUM PHOSPHATE 4 MG/ML IJ SOLN: 4.0000 mg | INTRAMUSCULAR | Status: DC

## 2021-09-06 MED ORDER — CHLORHEXIDINE GLUCONATE 0.12 % MT SOLN
15.0000 mL | Freq: Once | OROMUCOSAL | Status: AC
  Administered 2021-09-06: 15 mL via OROMUCOSAL

## 2021-09-06 MED ORDER — ONDANSETRON HCL 4 MG/2ML IJ SOLN: 4.0000 mg | Freq: Once | INTRAMUSCULAR | Status: DC | PRN

## 2021-09-06 MED ORDER — LIDOCAINE HCL (CARDIAC) PF 100 MG/5ML IV SOSY
PREFILLED_SYRINGE | INTRAVENOUS | Status: DC | PRN
  Administered 2021-09-06: 100 mg via INTRAVENOUS

## 2021-09-06 MED ORDER — KETOROLAC TROMETHAMINE 15 MG/ML IJ SOLN
15.0000 mg | INTRAMUSCULAR | Status: AC
  Administered 2021-09-06: 15 mg via INTRAVENOUS

## 2021-09-06 MED ORDER — DEXAMETHASONE SODIUM PHOSPHATE 10 MG/ML IJ SOLN
INTRAMUSCULAR | Status: DC | PRN
  Administered 2021-09-06: 10 mg via INTRAVENOUS

## 2021-09-06 MED ORDER — HYDROMORPHONE HCL 1 MG/ML IJ SOLN
INTRAMUSCULAR | Status: AC
  Filled 2021-09-06: qty 1

## 2021-09-06 MED ORDER — DEXAMETHASONE SODIUM PHOSPHATE 10 MG/ML IJ SOLN
INTRAMUSCULAR | Status: AC
  Filled 2021-09-06: qty 1

## 2021-09-06 MED ORDER — STERILE WATER FOR IRRIGATION IR SOLN
Status: DC | PRN
  Administered 2021-09-06: 1000 mL

## 2021-09-06 MED ORDER — ACETAMINOPHEN 500 MG PO TABS
1000.0000 mg | ORAL_TABLET | ORAL | Status: AC
  Administered 2021-09-06: 1000 mg via ORAL
  Filled 2021-09-06: qty 2

## 2021-09-06 MED ORDER — PROPOFOL 10 MG/ML IV BOLUS
INTRAVENOUS | Status: AC
  Filled 2021-09-06: qty 20

## 2021-09-06 MED ORDER — SUGAMMADEX SODIUM 200 MG/2ML IV SOLN
INTRAVENOUS | Status: DC | PRN
  Administered 2021-09-06: 200 mg via INTRAVENOUS

## 2021-09-06 MED ORDER — KETOROLAC TROMETHAMINE 30 MG/ML IJ SOLN
INTRAMUSCULAR | Status: DC | PRN
  Administered 2021-09-06: 30 mg via INTRAVENOUS

## 2021-09-06 MED ORDER — CEFAZOLIN SODIUM-DEXTROSE 2-4 GM/100ML-% IV SOLN
2.0000 g | INTRAVENOUS | Status: AC
  Administered 2021-09-06: 2 g via INTRAVENOUS
  Filled 2021-09-06: qty 100

## 2021-09-06 MED ORDER — SODIUM CHLORIDE 0.9 % IR SOLN
Status: DC | PRN
  Administered 2021-09-06: 1000 mL

## 2021-09-06 MED ORDER — GABAPENTIN 300 MG PO CAPS
300.0000 mg | ORAL_CAPSULE | ORAL | Status: AC
  Administered 2021-09-06: 300 mg via ORAL
  Filled 2021-09-06: qty 1

## 2021-09-06 MED ORDER — HYDROMORPHONE HCL 1 MG/ML IJ SOLN
0.2500 mg | INTRAMUSCULAR | Status: DC | PRN
Start: 2021-09-06 — End: 2021-09-06
  Administered 2021-09-06: 0.5 mg via INTRAVENOUS

## 2021-09-06 MED ORDER — POVIDONE-IODINE 10 % EX SWAB
2.0000 "application " | Freq: Once | CUTANEOUS | Status: AC
  Administered 2021-09-06: 2 via TOPICAL

## 2021-09-06 MED ORDER — 0.9 % SODIUM CHLORIDE (POUR BTL) OPTIME
TOPICAL | Status: DC | PRN
  Administered 2021-09-06: 1000 mL

## 2021-09-06 MED ORDER — MIDAZOLAM HCL 2 MG/2ML IJ SOLN
INTRAMUSCULAR | Status: AC
  Filled 2021-09-06: qty 2

## 2021-09-06 MED ORDER — LACTATED RINGERS IR SOLN
Status: DC | PRN
  Administered 2021-09-06: 1000 mL

## 2021-09-06 MED ORDER — PHENYLEPHRINE 40 MCG/ML (10ML) SYRINGE FOR IV PUSH (FOR BLOOD PRESSURE SUPPORT)
PREFILLED_SYRINGE | INTRAVENOUS | Status: DC | PRN
  Administered 2021-09-06: 80 ug via INTRAVENOUS

## 2021-09-06 MED ORDER — LACTATED RINGERS IV SOLN: INTRAVENOUS | Status: DC | PRN

## 2021-09-06 MED ORDER — ROCURONIUM BROMIDE 10 MG/ML (PF) SYRINGE
PREFILLED_SYRINGE | INTRAVENOUS | Status: AC
  Filled 2021-09-06: qty 10

## 2021-09-06 MED ORDER — BUPIVACAINE HCL 0.25 % IJ SOLN
INTRAMUSCULAR | Status: AC
  Filled 2021-09-06: qty 1

## 2021-09-06 MED ORDER — OXYCODONE HCL 5 MG PO TABS
5.0000 mg | ORAL_TABLET | Freq: Once | ORAL | Status: AC | PRN
  Administered 2021-09-06: 5 mg via ORAL

## 2021-09-06 MED ORDER — BUPIVACAINE LIPOSOME 1.3 % IJ SUSP
INTRAMUSCULAR | Status: DC | PRN
  Administered 2021-09-06: 20 mL

## 2021-09-06 MED ORDER — BUPIVACAINE LIPOSOME 1.3 % IJ SUSP
INTRAMUSCULAR | Status: AC
  Filled 2021-09-06: qty 20

## 2021-09-06 MED ORDER — OXYCODONE HCL 5 MG/5ML PO SOLN: 5.0000 mg | Freq: Once | ORAL | Status: AC | PRN

## 2021-09-06 MED ORDER — FENTANYL CITRATE (PF) 100 MCG/2ML IJ SOLN
INTRAMUSCULAR | Status: DC | PRN
  Administered 2021-09-06: 100 ug via INTRAVENOUS
  Administered 2021-09-06 (×2): 50 ug via INTRAVENOUS

## 2021-09-06 MED ORDER — ENSURE PRE-SURGERY PO LIQD
296.0000 mL | Freq: Once | ORAL | Status: DC
  Filled 2021-09-06: qty 296

## 2021-09-06 MED ORDER — OXYCODONE HCL 5 MG PO TABS
ORAL_TABLET | ORAL | Status: AC
  Filled 2021-09-06: qty 1

## 2021-09-06 MED ORDER — KETOROLAC TROMETHAMINE 15 MG/ML IJ SOLN
INTRAMUSCULAR | Status: AC
  Filled 2021-09-06: qty 1

## 2021-09-06 MED ORDER — HEPARIN SODIUM (PORCINE) 5000 UNIT/ML IJ SOLN
5000.0000 [IU] | INTRAMUSCULAR | Status: AC
  Administered 2021-09-06: 5000 [IU] via SUBCUTANEOUS
  Filled 2021-09-06: qty 1

## 2021-09-06 MED ORDER — SCOPOLAMINE 1 MG/3DAYS TD PT72
1.0000 | MEDICATED_PATCH | TRANSDERMAL | Status: DC
  Administered 2021-09-06: 1.5 mg via TRANSDERMAL
  Filled 2021-09-06: qty 1

## 2021-09-06 MED ORDER — KETOROLAC TROMETHAMINE 30 MG/ML IJ SOLN
INTRAMUSCULAR | Status: AC
  Filled 2021-09-06: qty 1

## 2021-09-06 SURGICAL SUPPLY — 65 items
APPLICATOR SURGIFLO ENDO (HEMOSTASIS) IMPLANT
BAG COUNTER SPONGE SURGICOUNT (BAG) IMPLANT
BAG LAPAROSCOPIC 12 15 PORT 16 (BASKET) ×2 IMPLANT
BAG RETRIEVAL 12/15 (BASKET) ×4
BLADE SURG SZ10 CARB STEEL (BLADE) ×2 IMPLANT
COVER BACK TABLE 60X90IN (DRAPES) ×2 IMPLANT
COVER TIP SHEARS 8 DVNC (MISCELLANEOUS) ×1 IMPLANT
COVER TIP SHEARS 8MM DA VINCI (MISCELLANEOUS) ×1
DECANTER SPIKE VIAL GLASS SM (MISCELLANEOUS) ×2 IMPLANT
DERMABOND ADVANCED (GAUZE/BANDAGES/DRESSINGS) ×1
DERMABOND ADVANCED .7 DNX12 (GAUZE/BANDAGES/DRESSINGS) ×1 IMPLANT
DRAPE ARM DVNC X/XI (DISPOSABLE) ×4 IMPLANT
DRAPE COLUMN DVNC XI (DISPOSABLE) ×1 IMPLANT
DRAPE DA VINCI XI ARM (DISPOSABLE) ×4
DRAPE DA VINCI XI COLUMN (DISPOSABLE) ×1
DRAPE SHEET LG 3/4 BI-LAMINATE (DRAPES) ×2 IMPLANT
DRAPE SURG IRRIG POUCH 19X23 (DRAPES) ×2 IMPLANT
DRSG OPSITE POSTOP 4X6 (GAUZE/BANDAGES/DRESSINGS) IMPLANT
DRSG OPSITE POSTOP 4X8 (GAUZE/BANDAGES/DRESSINGS) IMPLANT
ELECT PENCIL ROCKER SW 15FT (MISCELLANEOUS) ×2 IMPLANT
ELECT REM PT RETURN 15FT ADLT (MISCELLANEOUS) ×2 IMPLANT
GAUZE 4X4 16PLY ~~LOC~~+RFID DBL (SPONGE) ×2 IMPLANT
GLOVE SURG ENC MOIS LTX SZ6 (GLOVE) ×8 IMPLANT
GLOVE SURG ENC MOIS LTX SZ6.5 (GLOVE) ×4 IMPLANT
GOWN STRL REUS W/ TWL LRG LVL3 (GOWN DISPOSABLE) ×4 IMPLANT
GOWN STRL REUS W/TWL LRG LVL3 (GOWN DISPOSABLE) ×4
HOLDER FOLEY CATH W/STRAP (MISCELLANEOUS) IMPLANT
IRRIG SUCT STRYKERFLOW 2 WTIP (MISCELLANEOUS) ×2
IRRIGATION SUCT STRKRFLW 2 WTP (MISCELLANEOUS) ×1 IMPLANT
KIT TURNOVER KIT A (KITS) IMPLANT
MANIPULATOR ADVINCU DEL 3.0 PL (MISCELLANEOUS) IMPLANT
MANIPULATOR ADVINCU DEL 4.0 PL (MISCELLANEOUS) ×2 IMPLANT
MANIPULATOR UTERINE 4.5 ZUMI (MISCELLANEOUS) IMPLANT
NEEDLE HYPO 21X1.5 SAFETY (NEEDLE) ×2 IMPLANT
OBTURATOR OPTICAL STANDARD 8MM (TROCAR) ×1
OBTURATOR OPTICAL STND 8 DVNC (TROCAR) ×1
OBTURATOR OPTICALSTD 8 DVNC (TROCAR) ×1 IMPLANT
PACK ROBOT GYN CUSTOM WL (TRAY / TRAY PROCEDURE) ×2 IMPLANT
PAD POSITIONING PINK XL (MISCELLANEOUS) ×2 IMPLANT
PORT ACCESS TROCAR AIRSEAL 12 (TROCAR) ×1 IMPLANT
PORT ACCESS TROCAR AIRSEAL 5M (TROCAR) ×1
POUCH SPECIMEN RETRIEVAL 10MM (ENDOMECHANICALS) IMPLANT
RTRCTR WOUND ALEXIS 18CM SML (INSTRUMENTS) ×2
SAVER CELL AAL HAEMONETICS (INSTRUMENTS) ×1 IMPLANT
SCRUB EXIDINE 4% CHG 4OZ (MISCELLANEOUS) ×2 IMPLANT
SEAL CANN UNIV 5-8 DVNC XI (MISCELLANEOUS) ×4 IMPLANT
SEAL XI 5MM-8MM UNIVERSAL (MISCELLANEOUS) ×4
SET IRRIG Y TYPE TUR BLADDER L (SET/KITS/TRAYS/PACK) ×2 IMPLANT
SET TRI-LUMEN FLTR TB AIRSEAL (TUBING) ×2 IMPLANT
SPONGE T-LAP 18X18 ~~LOC~~+RFID (SPONGE) ×2 IMPLANT
SURGIFLO W/THROMBIN 8M KIT (HEMOSTASIS) IMPLANT
SUT MNCRL AB 4-0 PS2 18 (SUTURE) ×2 IMPLANT
SUT PDS AB 1 TP1 96 (SUTURE) ×4 IMPLANT
SUT VIC AB 0 CT1 27 (SUTURE)
SUT VIC AB 0 CT1 27XBRD ANTBC (SUTURE) IMPLANT
SUT VIC AB 2-0 CT1 27 (SUTURE) ×1
SUT VIC AB 2-0 CT1 TAPERPNT 27 (SUTURE) ×1 IMPLANT
SUT VIC AB 4-0 PS2 18 (SUTURE) ×6 IMPLANT
TOWEL OR NON WOVEN STRL DISP B (DISPOSABLE) IMPLANT
TRAP SPECIMEN MUCUS 40CC (MISCELLANEOUS) ×2 IMPLANT
TRAY FOLEY MTR SLVR 16FR STAT (SET/KITS/TRAYS/PACK) ×2 IMPLANT
TROCAR XCEL NON-BLD 5MMX100MML (ENDOMECHANICALS) ×2 IMPLANT
UNDERPAD 30X36 HEAVY ABSORB (UNDERPADS AND DIAPERS) ×4 IMPLANT
WATER STERILE IRR 1000ML POUR (IV SOLUTION) ×2 IMPLANT
YANKAUER SUCT BULB TIP 10FT TU (MISCELLANEOUS) ×2 IMPLANT

## 2021-09-06 NOTE — H&P (Signed)
Gynecologic Oncology H&P  09/06/21  HPI: Alyssa Oliver is a 45 y.o. y.o. female who is seen in consultation at the request of Dr. Terri Piedra for an evaluation of complex adnexal mass, known large fibroid uterus.   Patient endorses a long history of fibroids.  At after moving to [facility] from [place] in 2018, she notes progressive worsening of her menses.  She has regular menses approximately once a month but will have bleeding that lasts up to 2 to 3 weeks every month.  On her heaviest days, she will wear 2 super pads and go through a pack a day.  She has previously tried several types of birth control pills to help manage her bleeding but had significant clots when she used OCPs.  She more recently has been on Lysteda which has helped decrease her flow.  She starts this when her menses started each month.  She endorses pelvic pain and cramping with her menses.   At the time of her visit at the end of 2021, in the setting of abnormal bleeding, the patient underwent endometrial biopsy.  Pathology from this revealed benign endocervical tissue, no endometrial tissue.   She endorses a good appetite without nausea or emesis.  She denies any urinary symptoms and reports normal bowel function.   Given a history of iron deficiency anemia, she has been on iron for months.  She has thyroid disease and now is on levo thyroxine.  She had previously been on methimazole at some point while she was in [place] but then stopped stopped medication completely as she was told her thyroid was functioning normally.  After restarting thyroid medication with levothyroxine after moving to [facility], her periods improve some initially.   Patient lives in [place] with her 2 children who are 74 and 20.  Her husband is currently in the [place] although was just recently in town.    Past Medical/Surgical History: Past Medical History:  Diagnosis Date   Acute appendicitis 03/17/2019   Adnexal mass     Diabetes mellitus without complication ([facility])    Fibroid uterus    Headache    Hypertension    Thyroid disease     Past Surgical History:  Procedure Laterality Date   APPENDECTOMY  03/17/2019   CESAREAN SECTION     LAPAROSCOPIC APPENDECTOMY N/A 03/17/2019   Procedure: APPENDECTOMY LAPAROSCOPIC;  Surgeon: Coralie Keens, MD;  Location: [facility];  Service: General;  Laterality: N/A;    Family History  Problem Relation Age of Onset   Colon cancer Neg Hx    Breast cancer Neg Hx    Ovarian cancer Neg Hx    Endometrial cancer Neg Hx    Pancreatic cancer Neg Hx    Prostate cancer Neg Hx     Social History   Socioeconomic History   Marital status: Married    Spouse name: Not on file   Number of children: Not on file   Years of education: Not on file   Highest education level: Not on file  Occupational History   Not on file  Tobacco Use   Smoking status: Never   Smokeless tobacco: Never  Vaping Use   Vaping Use: Never used  Substance and Sexual Activity   Alcohol use: Not Currently   Drug use: Never   Sexual activity: Not Currently    Birth control/protection: None  Other Topics Concern   Not on file  Social History Narrative   Not on file   Social  Determinants of Health   Financial Resource Strain: Not on file  Food Insecurity: Not on file  Transportation Needs: Not on file  Physical Activity: Not on file  Stress: Not on file  Social Connections: Not on file    Current Medications:  Current Facility-Administered Medications:    ceFAZolin (ANCEF) IVPB 2g/100 mL premix, 2 g, Intravenous, On Call to OR, Cross, Melissa D, NP   dexamethasone (DECADRON) injection 4 mg, 4 mg, Intravenous, On Call to OR, Cross, Melissa D, NP   [START ON 09/07/2021] feeding supplement (ENSURE PRE-SURGERY) liquid 296 mL, 296 mL, Oral, Once, Cross, Melissa D, NP   feeding supplement (ENSURE PRE-SURGERY) liquid 592 mL, 592 mL, Oral, Once, Cross, Melissa D, NP   ketorolac (TORADOL) 15 MG/ML  injection 15 mg, 15 mg, Intravenous, On Call to OR, Cross, Melissa D, NP   lactated ringers infusion, , Intravenous, Continuous, Stoltzfus, March Rummage, DO, Last Rate: 10 mL/hr at 09/06/21 2563, Continued from Pre-op at 09/06/21 0652   scopolamine (TRANSDERM-SCOP) 1 MG/3DAYS 1.5 mg, 1 patch, Transdermal, On Call to OR, Cross, Melissa D, NP, 1.5 mg at 09/06/21 8937  Review of Systems: Denies appetite changes, fevers, chills, fatigue, unexplained weight changes. Denies hearing loss, neck lumps or masses, mouth sores, ringing in ears or voice changes. Denies cough or wheezing.  Denies shortness of breath. Denies chest pain or palpitations. + right leg swelling, recent doppler negative for DVT. Denies abdominal distention, pain, blood in stools, constipation, diarrhea, nausea, vomiting, or early satiety. Denies pain with intercourse, dysuria, frequency, hematuria or incontinence. Denies hot flashes, pelvic pain, vaginal bleeding or vaginal discharge.   Denies joint pain, back pain or muscle pain/cramps. Denies itching, rash, or wounds. Denies dizziness, headaches, numbness or seizures. Denies swollen lymph nodes or glands, denies easy bruising or bleeding. Denies anxiety, depression, confusion, or decreased concentration.  Physical Exam: BP (!) 156/110   Pulse 77   Temp 99 F (37.2 C) (Oral)   Wt 241 lb 6.4 oz (109.5 kg)   LMP 08/24/2021 (Exact Date)   SpO2 100%   BMI 40.17 kg/m  General: Alert, oriented, no acute distress.  HEENT: Normocephalic, atraumatic. Sclera anicteric.  Chest: Clear to auscultation bilaterally. No wheezes, rhonchi, or rales. Cardiovascular: Regular rate and rhythm, no murmurs, rubs, or gallops.  Abdomen: Obese. Normoactive bowel sounds. Soft, nondistended, nontender to palpation. No masses or hepatosplenomegaly appreciated. No palpable fluid wave.   Laboratory & Radiologic Studies: CA-125 on 09/09/20: 48  Pelvic MRI 07/06/21: IMPRESSION: Large indeterminate  LEFT adnexal/para-uterine mass with potential connection to the LEFT lateral uterus, as described. This appears to be associated with the uterus and involving or adjacent to a fundal leiomyoma, also adjacent to the LEFT ovary with slow growth since 2020. Findings may reflect a pelvic sarcoma, cystic ovarian neoplasm or perhaps sequela of remote torsed pedunculated fibroid with variable degrees of necrosis/limited blood supply.   Concerning features associated with the above finding are the heterogeneous enhancement pattern and the presence of ascites, for this reason gyn consultation is suggested as neoplasm is the primary differential consideration at this time, perhaps indolent given slow enlargement since 2020.   Numerous leiomyomata with intracavitary/entirely submucosal cervical leiomyoma versus is prolapsed leiomyoma extending from lower uterine segment into the cervix.   Endometrium is distorted by numerous leiomyomata, not grossly thickened or heterogeneous but not well assessed. Correlation with direct visualization may ultimately be warranted, potentially given the potential for prolapsed leiomyoma.   Normal RIGHT ovary.  Assessment & Plan:  Alyssa Oliver is a 45 y.o. woman with enlarged fibroid uterus and complex adnexal mass.  Definitive surgery today with total hysterectomy, possible oophorectomy, bilateral salpingectomy, possible staging. Will attempt robotically with mini-lap for specimen removal. Cell saver available if have to convert to open.  Jeral Pinch, MD  Division of Gynecologic Oncology  Department of Obstetrics and Gynecology  [facility]

## 2021-09-06 NOTE — Anesthesia Postprocedure Evaluation (Signed)
Anesthesia Post Note  Patient: Alyssa Oliver  Procedure(s) Performed: XI ROBOTIC ASSISTED LAPAROSCOPIC HYSTERECTOMY GREATER THAN TWO HUNDRED AND FIFTY GRAMS, BILATERAL SALPINGECTOMY,MINI LAPAROTOMY FOR SPECIMEN REMOVAL, CYSTOSCOPY     Patient location during evaluation: PACU Anesthesia Type: General Level of consciousness: awake and alert Pain management: pain level controlled Vital Signs Assessment: post-procedure vital signs reviewed and stable Respiratory status: spontaneous breathing, nonlabored ventilation, respiratory function stable and patient connected to nasal cannula oxygen Cardiovascular status: blood pressure returned to baseline and stable Postop Assessment: no apparent nausea or vomiting Anesthetic complications: no   No notable events documented.  Last Vitals:  Vitals:   09/06/21 1200 09/06/21 1220  BP: (!) 143/72 (!) 150/70  Pulse:  (!) 57  Resp:  16  Temp:  36.6 C  SpO2:  100%    Last Pain:  Vitals:   09/06/21 1220  TempSrc:   PainSc: 4                  [name]

## 2021-09-06 NOTE — Discharge Instructions (Addendum)

## 2021-09-06 NOTE — Anesthesia Procedure Notes (Signed)
Procedure Name: Intubation Date/Time: 09/06/2021 7:29 AM Performed by: [place] Cailan Antonucci C, CRNA Pre-anesthesia Checklist: Patient identified, Emergency Drugs available, Suction available and Patient being monitored Patient Re-evaluated:Patient Re-evaluated prior to induction Oxygen Delivery Method: Circle system utilized Preoxygenation: Pre-oxygenation with 100% oxygen Induction Type: IV induction Ventilation: Mask ventilation without difficulty Laryngoscope Size: Mac and 3 Grade View: Grade I Tube type: Oral Number of attempts: 1 Airway Equipment and Method: Stylet and Oral airway Placement Confirmation: ETT inserted through vocal cords under direct vision, positive ETCO2 and breath sounds checked- equal and bilateral Tube secured with: Tape Dental Injury: Teeth and Oropharynx as per pre-operative assessment

## 2021-09-06 NOTE — Transfer of Care (Signed)
Immediate Anesthesia Transfer of Care Note  Patient: Alyssa Oliver  Procedure(s) Performed: XI ROBOTIC ASSISTED LAPAROSCOPIC HYSTERECTOMY GREATER THAN TWO HUNDRED AND FIFTY GRAMS, BILATERAL SALPINGECTOMY,MINI LAPAROTOMY FOR SPECIMEN REMOVAL, CYSTOSCOPY  Patient Location: PACU  Anesthesia Type:General  Level of Consciousness: drowsy  Airway & Oxygen Therapy: Patient Spontanous Breathing and Patient connected to face mask oxygen  Post-op Assessment: Report given to RN and Post -op Vital signs reviewed and stable  Post vital signs: Reviewed and stable  Last Vitals:  Vitals Value Taken Time  BP 125/65 09/06/21 1045  Temp    Pulse 74 09/06/21 1049  Resp 18 09/06/21 1049  SpO2 100 % 09/06/21 1049  Vitals shown include unvalidated device data.  Last Pain:  Vitals:   09/06/21 0612  TempSrc:   PainSc: 0-No pain         Complications: No notable events documented.

## 2021-09-06 NOTE — Op Note (Signed)
OPERATIVE NOTE  Pre-operative Diagnosis: Enlarged fibroid uterus, left adnexal mass,   Post-operative Diagnosis: Enlarged fibroid uterus, degenerated and torsed fundal fibroid  Operation: Robotic-assisted laparoscopic total hysterectomy with bilateral salpingectomy, mini-lap for specimen delivery, cystoscopy  Surgeon: Jeral Pinch MD  Assistant Surgeon: Lahoma Crocker MD (an MD assistant was necessary for tissue manipulation, management of robotic instrumentation, retraction and positioning due to the complexity of the case and hospital policies).   Anesthesia: GET  Urine Output: 150cc  Operative Findings: On EUA, large mobile uterus, approximately 20 cm. Prolapsing endocervical versus LUS fibroid. On intra-abdominal entry, normal upper abdominal survey. Uterus enlarged with multiple fibroids. Large 10-12 cm torsed and cystic appearing fibroid eminating form the left uterine fundus. Normal appearing tubes and ovaries. Significant adhesions were noted between the bladder and LUS/cervix. Minimal straw-colored ascites. No intra-abdominal or pelvic evidence of disease. Cystoscopy with excellent efflux from UOs bilaterally. Gross examination noted normal endometrium.  Estimated Blood Loss:  100cc      Total IV Fluids: see I&Os         Specimens: uterus, cervix, bilateral tubes, ascites/pelvic washings         Complications:  None; patient tolerated the procedure well.         Disposition: PACU - hemodynamically stable.  Procedure Details  The patient was seen in the Holding Room. The risks, benefits, complications, treatment options, and expected outcomes were discussed with the patient.  The patient concurred with the proposed plan, giving informed consent.  The site of surgery properly noted/marked. The patient was identified as Alyssa Oliver and the procedure verified as a Robotic-assisted hysterectomy with bilateral salpingectomy.   After induction of anesthesia, the patient was  draped and prepped in the usual sterile manner. Patient was placed in supine position after anesthesia and draped and prepped in the usual sterile manner as follows: Her arms were tucked to her side with all appropriate precautions.  The shoulders were stabilized with padded shoulder blocks applied to the acromium processes.  The patient was placed in the semi-lithotomy position in [place].  The perineum and vagina were prepped with CholoraPrep. The patient was draped after the CholoraPrep had been allowed to dry for 3 minutes.  A Time Out was held and the above information confirmed.  The urethra was prepped with Betadine. Foley catheter was placed.  A sterile speculum was placed in the vagina.  The cervix was grasped with a single-tooth tenaculum. The cervix was dilated with Kennon Rounds dilators.  The 4.0 Delineator uterine manipulator with a colpotomizer ring was placed without difficulty.  A pneum occluder balloon was placed over the manipulator.  OG tube placement was confirmed and to suction.   Next, a 10 mm skin incision was made 1 cm below the subcostal margin in the midclavicular line.  The 5 mm Optiview port and scope was used for direct entry.  Opening pressure was under 10 mm CO2.  The abdomen was insufflated and the findings were noted as above.   At this point and all points during the procedure, the patient's intra-abdominal pressure did not exceed 15 mmHg. Next, an 8 mm skin incision was made superior to the umbilicus and a right and left port were placed about 8 cm lateral to the robot port on the right and left side.  A fourth arm was placed on the right.  The 5 mm assist trocar was exchanged for a 10-12 mm port. All ports were placed under direct visualization.  The patient was  placed in steep Trendelenburg. The robot was docked in the normal manner.  The right and left peritoneum were opened parallel to the IP ligament to open the retroperitoneal spaces bilaterally. The round ligaments  were transected. The ureter was again noted to be on the medial leaf of the broad ligament.  The fallopian tube was elevated and electrocautery was used to cauterize and then transect just inferior to the fallopian tube, free it from the ovary. The utero-ovarian ligament was then skeletonized, cauterized and cut.  This was performed bilaterally.  The posterior peritoneum was taken down to the level of the KOH ring.  The anterior peritoneum was also taken down.  The bladder flap was created to the level of the KOH ring.  The uterine artery on the right side was skeletonized, cauterized and cut in the normal manner.  A similar procedure was performed on the left.  The colpotomy was made and the uterus, cervix, bilateral tubes detached. The pedunculated fibroid was detached from its stalk form the uterus using monopolar electrocautery. The cystic fibroid was placed in an Endocatch bag as was the uterus/cervix.  Pedicles were inspected and excellent hemostasis was achieved.    The colpotomy at the vaginal cuff was closed with Vicryl on a CT1 needle in a running manner.  Irrigation was used and excellent hemostasis was achieved.  At this point in the procedure was completed.  Robotic instruments were removed under direct visulaization.  The robot was undocked.   With the abdomen still insufflated, the supraumbilical incision was extended using the scalpel and monopolar electrocautery. The fascial incision was opened using monopolar electrocautery. The peritoneum was stretched. A small alexis retractor was then placed. Each Endocatch bag was brought through the incision and in a contained manner, morcellation of the specimens was performed to allow for their delivery. The uterus was sent for frozen section to assure endometrium normal in appearance since no pre-op sampling was possible.   The abdomen was irrigated. The fascia of the mini-lap incision was closed with two running sutures of #1 PDS tied at the  midline. The subcutaneous tissue was irrigated and made hemostatic. Exparel was injected for local anesthesia. The subcutaneous tissue was reapproximated with 2-0 Vicryl.   The fascia at the 10-12 mm port was closed with 0 Vicryl on a UR-5 needle.  The subcuticular tissue at all sites was closed with 4-0 Vicryl and the skin was closed with 4-0 Monocryl in a subcuticular manner.  Dermabond was applied.    Foley catheter was removed. Cystoscopy was performed given low UOP for the surgery. Excellent efflux noted from both ureteral orifices.   The vagina was swabbed with minimal bleeding noted.   All sponge, lap and needle counts were correct x  3.   The patient was transferred to the recovery room in stable condition.  Jeral Pinch, MD

## 2021-09-07 ENCOUNTER — Encounter (HOSPITAL_COMMUNITY): Payer: Self-pay | Admitting: Gynecologic Oncology

## 2021-09-07 ENCOUNTER — Telehealth: Payer: Self-pay

## 2021-09-07 LAB — SURGICAL PATHOLOGY

## 2021-09-07 LAB — CYTOLOGY - NON PAP

## 2021-09-07 NOTE — Telephone Encounter (Signed)
Spoke with Alyssa Oliver this afternoon. She states she is drinking and urinating well. She has not eaten much but is going to try eating more this afternoon. She has not had a BM yet but is passing gas. Encouraged her to drink plenty of water and to take senokot to prevent constipation . She denies fever or chills. Incisions are dry and intact. She rates her pain as an 8/10. She is alternating tylenol and ibuprofen. She took oxycodone last night which helped but she states she is trying not to take it. She reports pain is increased with ambulation but resting gives her pain relief.   Reviewed surgical pathology results with patient. Per Dr. Berline Lopes there is no evidence of malignancy. Patient verbalized understanding and happy with news.  Instructed to call office with any fever, chills, purulent drainage, uncontrolled pain or any other questions or concerns. Patient verbalizes understanding.   Pt aware of post op appointments as well as the office number [phone] and after hours number [phone] to call if she has any questions or concerns

## 2021-09-10 ENCOUNTER — Telehealth: Payer: Self-pay

## 2021-09-10 ENCOUNTER — Telehealth: Payer: Self-pay | Admitting: *Deleted

## 2021-09-10 NOTE — Telephone Encounter (Signed)
Fax short term disability forms to [employer] and [employer] LOA form

## 2021-09-10 NOTE — Telephone Encounter (Signed)
Received call from Ms. Alyssa Oliver this morning. Patient requesting a copy of her pictures from surgery be emailed to her. Per Dr. Berline Lopes, patient will receive a copy of the images at her post-op appointment on 12/30. We do not have a secure way of sending the images. Patient verbalized understanding. Instructed to call with any needs.

## 2021-09-13 ENCOUNTER — Telehealth: Payer: Self-pay | Admitting: *Deleted

## 2021-09-13 ENCOUNTER — Other Ambulatory Visit: Payer: Self-pay | Admitting: Gynecologic Oncology

## 2021-09-13 ENCOUNTER — Inpatient Hospital Stay (HOSPITAL_BASED_OUTPATIENT_CLINIC_OR_DEPARTMENT_OTHER): Payer: No Typology Code available for payment source | Admitting: Gynecologic Oncology

## 2021-09-13 ENCOUNTER — Inpatient Hospital Stay: Payer: No Typology Code available for payment source

## 2021-09-13 ENCOUNTER — Other Ambulatory Visit: Payer: Self-pay

## 2021-09-13 ENCOUNTER — Ambulatory Visit (HOSPITAL_COMMUNITY)
Admission: RE | Admit: 2021-09-13 | Discharge: 2021-09-13 | Disposition: A | Discharge status: Home / Self Care | Payer: No Typology Code available for payment source | Source: Ambulatory Visit | Attending: Gynecologic Oncology | Admitting: Gynecologic Oncology

## 2021-09-13 VITALS — BP 129/77 | HR 76 | Temp 98.7°F | Resp 18

## 2021-09-13 DIAGNOSIS — G8918 Other acute postprocedural pain: Secondary | ICD-10-CM

## 2021-09-13 DIAGNOSIS — R5082 Postprocedural fever: Secondary | ICD-10-CM

## 2021-09-13 DIAGNOSIS — R1084 Generalized abdominal pain: Secondary | ICD-10-CM

## 2021-09-13 DIAGNOSIS — R109 Unspecified abdominal pain: Secondary | ICD-10-CM

## 2021-09-13 LAB — BASIC METABOLIC PANEL
Anion gap: 10 (ref 5–15)
BUN: 11 mg/dL (ref 6–20)
CO2: 30 mmol/L (ref 22–32)
Calcium: 9.9 mg/dL (ref 8.9–10.3)
Chloride: 101 mmol/L (ref 98–111)
Creatinine, Ser: 0.74 mg/dL (ref 0.44–1.00)
GFR, Estimated: >60 mL/min (ref >60)
Glucose, Bld: 105 mg/dL — ABNORMAL HIGH (ref 70–99)
Potassium: 4.2 mmol/L (ref 3.5–5.1)
Sodium: 141 mmol/L (ref 135–145)

## 2021-09-13 LAB — CBC WITH DIFFERENTIAL (CANCER CENTER ONLY)
Abs Immature Granulocytes: 0.03 10*3/uL (ref 0.00–0.07)
Basophils Absolute: 0 10*3/uL (ref 0.0–0.1)
Basophils Relative: 1 %
Eosinophils Absolute: 0.2 10*3/uL (ref 0.0–0.5)
Eosinophils Relative: 3 %
HCT: 35.1 % — ABNORMAL LOW (ref 36.0–46.0)
Hemoglobin: 11.3 g/dL — ABNORMAL LOW (ref 12.0–15.0)
Immature Granulocytes: 0 %
Lymphocytes Relative: 21 %
Lymphs Abs: 1.5 10*3/uL (ref 0.7–4.0)
MCH: 28.5 pg (ref 26.0–34.0)
MCHC: 32.2 g/dL (ref 30.0–36.0)
MCV: 88.6 fL (ref 80.0–100.0)
Monocytes Absolute: 0.6 10*3/uL (ref 0.1–1.0)
Monocytes Relative: 9 %
Neutro Abs: 5 10*3/uL (ref 1.7–7.7)
Neutrophils Relative %: 66 %
Platelet Count: 482 10*3/uL — ABNORMAL HIGH (ref 150–400)
RBC: 3.96 MIL/uL (ref 3.87–5.11)
RDW: 15 % (ref 11.5–15.5)
WBC Count: 7.4 10*3/uL (ref 4.0–10.5)
nRBC: 0 % (ref 0.0–0.2)

## 2021-09-13 LAB — URINALYSIS, COMPLETE (UACMP) WITH MICROSCOPIC
Bilirubin Urine: NEGATIVE
Glucose, UA: NEGATIVE mg/dL
Nitrite: NEGATIVE
Protein, ur: 30 mg/dL — AB
RBC / HPF: >50 RBC/hpf — ABNORMAL HIGH (ref 0–5)
Specific Gravity, Urine: 1.02 (ref 1.005–1.030)
WBC, UA: >50 WBC/hpf — ABNORMAL HIGH (ref 0–5)
pH: 6.5 (ref 5.0–8.0)

## 2021-09-13 MED ORDER — CEFDINIR 300 MG PO CAPS: 300.0000 mg | ORAL_CAPSULE | Freq: Two times a day (BID) | ORAL | 0 refills | Status: AC

## 2021-09-13 MED ORDER — IOHEXOL 300 MG/ML  SOLN
100.0000 mL | Freq: Once | INTRAMUSCULAR | Status: AC | PRN
  Administered 2021-09-13: 100 mL via INTRAVENOUS

## 2021-09-13 NOTE — Progress Notes (Signed)
See RN note. Patient coming in today for evaluation of increasing post-op pain, low grade temperature, right flank pain. CT ordered to begin authorization for probable need if no obvious exam findings at today's visit.

## 2021-09-13 NOTE — Progress Notes (Signed)
GYN Oncology Symptom Management  HPI: Alyssa Oliver is a 45 year old female s/p robotic-assisted laparoscopic total hysterectomy with bilateral salpingectomy, mini-lap for specimen delivery, cystoscopy with Dr. Jeral Pinch on 09/06/2021. Her post-operative course was uneventful until POD 7 when she called the office with severe abdominal cramping.  She has had incisional pain since surgery but she was woken up this morning with severe abdominal cramping.  She states the cramping is constant and she took an oxycodone with little relief along with an ibuprofen to follow.  She states the pain also radiates to her back.  She is tolerating her diet with no nausea or vomiting. Her bowels have been moving and she has had to take the Senokot twice.  She states closer to the surgery date afterwards, she had some loose stools so she did not take the Senokot at that time.  She feels like she is emptying her bladder completely with no dysuria.  She has had some spotting since surgery up until Sunday. On Sunday,  she had no spotting until this morning when the cramping developed. She reports the pain increases with movement.     She had a low-grade temperature earlier this week with chills on 2 nights to follow with no symptoms since that time.  No concerns voiced about her incisions.  She reports more tenderness at the mini laparotomy incision in the upper abdomen.  Her lower leg edema is consistent with previous preop measures, with preop doppler negative for DVT. She is asking about wearing her compression hose. She has arrived to the clinic in a wheelchair and appears very uncomfortable when having to move.  Her sister is here helping her since surgery.  Exam: Alert, oriented, in no acute distress but appears uncomfortable due to abdominal pain Lungs clear. Heart regular in rate and rhythm.  Abdomen soft, tender at the mini laparotomy site but no masses or obvious hernias noted, all incisions intact with  dermabond present without erythema or drainage, active bowel sounds.  Pelvic examination with chaperone present. On speculum exam, the vaginal cuff is intact with sutures present. Bi-manual confirms with no masses or nodularity noted.  BLE edema, more in the right leg, non pitting  CBC    Component Value Date/Time   WBC 7.4 09/13/2021 1441   WBC 6.3 09/03/2021 1035   RBC 3.96 09/13/2021 1441   HGB 11.3 (L) 09/13/2021 1441   HCT 35.1 (L) 09/13/2021 1441   PLT 482 (H) 09/13/2021 1441   MCV 88.6 09/13/2021 1441   MCH 28.5 09/13/2021 1441   MCHC 32.2 09/13/2021 1441   RDW 15.0 09/13/2021 1441   LYMPHSABS 1.5 09/13/2021 1441   MONOABS 0.6 09/13/2021 1441   EOSABS 0.2 09/13/2021 1441   BASOSABS 0.0 09/13/2021 1441   BMET    Component Value Date/Time   NA 141 09/13/2021 1441   K 4.2 09/13/2021 1441   CL 101 09/13/2021 1441   CO2 30 09/13/2021 1441   GLUCOSE 105 (H) 09/13/2021 1441   BUN 11 09/13/2021 1441   CREATININE 0.74 09/13/2021 1441   CALCIUM 9.9 09/13/2021 1441   GFRNONAA >60 09/13/2021 1441    UA and culture pending.  Assessment/Plan: 45 year old female s/p robotic-assisted laparoscopic total hysterectomy with bilateral salpingectomy, mini-lap for specimen delivery, cystoscopy with Dr. Jeral Pinch on 09/06/2021 presenting to the office with severe abdominal cramping, back pain, low grade temp. Labs obtained and discussed with patient. Given her severe abdominal pain with low grade temp earlier this  week, we will plan to obtain a CT scan to rule out pelvic fluid collection, post-op infection, or other cause for development of symptoms. CT will be performed later today at [facility]. Patient will be contacted with the results. She is advised to call for any needs or concerns.

## 2021-09-13 NOTE — Telephone Encounter (Signed)
Spoke with Alyssa Oliver. Advised patient that Dr. Berline Lopes would like her to have her labs checked today and be seen by our Nurse Practitioner Alyssa Oliver.   Patient verbalized understanding. She states her sister is working from home and will be able to bring her later this afternoon. Lab appointment scheduled for 2:30pm and office visit scheduled for 2:45pm. Patient in agreement of appointments.

## 2021-09-13 NOTE — Progress Notes (Signed)
Spoke with patient about CT results - I do not see any obvious findings other that post-op changes. Given UA and back/kidney symptoms, I recommend treatment empirically for UTI vs. Pyelo. Given allergies, opted for cefdinir.   Jeral Pinch MD Gynecologic Oncology

## 2021-09-13 NOTE — Patient Instructions (Addendum)
Your lab work from today looks stable and within normal range. No concerning findings on pelvic examination. Plan to proceed with a CT scan of the abdomen and pelvis to further evaluate your pain. We will contact you with the results.

## 2021-09-13 NOTE — Telephone Encounter (Signed)
Following up with Alyssa Oliver. Patient reports cramping started at 0545 this morning and feels like she is starting her period. Cramping/pain is at the top of the vagina and lower abdomen. Pain is constant. Vaginal spotting started this morning when the cramping started. She has not needed to wear a pad or panty liner. She only sees blood when she wipes.  Last BM was yesterday 12/14, stool was very loose. No BM today. Last dose of senokot 12/13, she reports drinking at least 64 oz of water a day. Patient states pain increases slightly with BM. She reports BM's are not as frequent as they normally are.  Encouraged continued use of senokot to avoid constipation.  Patient has been eating and drinking like normal. No nausea or vomiting. She reports a fever on Sunday 12/11 of 100.3, but has not had one since.   Patient reports pain is currently 6-7/10. She has not been taking tylenol just ibuprofen and oxycodone. Instructed patient to alternate tylenol and ibuprofen every 6 hours and use oxycodone as needed and to apply a heating pad for comfort.   Patient reports she has developed kidney pain that has started in the last 2-3 days. Patient is concerned that this could be from the pain medicine.   Instructed patient that the Nurse Practitioner will be notified and our office will follow up with her later today. Patient verbalized understanding.

## 2021-09-13 NOTE — Telephone Encounter (Signed)
Patient called and stated "I had surgery 12/8 with Dr Berline Lopes. This morning my pain woke me up at 6 am. The pain is worse today than it has been. I have taken the Oxy at 7 am; then I took some ibuprofen at 9 am. I had no relief, and the pain level is still at an 8. It is a bad cramping pain in my lower abdomen. I do have some spotting, only when I wipe. It is pale red in color. I have not done any lifting and nothing in the vagina." Explained that the message would be given to [facility] APP and the office would call her back later today.

## 2021-09-14 ENCOUNTER — Telehealth: Payer: Self-pay

## 2021-09-14 ENCOUNTER — Ambulatory Visit (HOSPITAL_COMMUNITY): Payer: No Typology Code available for payment source

## 2021-09-14 DIAGNOSIS — G8918 Other acute postprocedural pain: Secondary | ICD-10-CM | POA: Insufficient documentation

## 2021-09-14 LAB — URINE CULTURE: Culture: <10000 — AB

## 2021-09-14 NOTE — Telephone Encounter (Signed)
Ms Jennings stated that she has taken 2 doses of the cefdinir.  Her abdominal cramping has significantly improved.  No burning with urination. Afebrile. She is pushing fluids.  Told her to call if she has a temp of 100.4 or higher and any increase in abdominal pain. Pt verbalized understanding.

## 2021-09-18 ENCOUNTER — Other Ambulatory Visit: Payer: Self-pay | Admitting: Gynecologic Oncology

## 2021-09-18 ENCOUNTER — Telehealth: Payer: Self-pay

## 2021-09-18 DIAGNOSIS — N9489 Other specified conditions associated with female genital organs and menstrual cycle: Secondary | ICD-10-CM

## 2021-09-18 MED ORDER — OXYCODONE HCL 5 MG PO TABS: 5.0000 mg | ORAL_TABLET | ORAL | 0 refills | Status: DC | PRN

## 2021-09-18 NOTE — Telephone Encounter (Signed)
Following up with Ms. Dhami this afternoon. Patient reports vaginal bleeding is diminished and she has occasional spotting. Advised patient to continue to monitor and notify our office for increased bleeding.  Patient reports an increase in lower abdominal pain. She rates her pain as 8-9/10. She describes the pain as an intense cramping or exploding. She has not taken pain medicine yet. Patient states she has one oxycodone left and is requesting a refill.   She denies nausea and vomiting. No fever or chills. She reports last BM today. BM are not back to her normal yet. She has been taking senokot intermittently when she remembers it. Encouraged her to drink plenty of fluids, take senokot as prescribed and incorporate miralax to prevent constipation.   [name], NP notified. Refill for oxycodone will be sent into patients pharmacy. Instructed patient to alternate taking ibuprofen and tylenol for better pain control. Advised to wear supportive leggings around her abdomen. Patient verbalized understanding and instructed to call with any needs. Patient has office number as well as after hours number.

## 2021-09-18 NOTE — Telephone Encounter (Signed)
Spoke with Ms. Wenzlick this morning, she reports her pain is improved from last week. She has not needed to take oxycodone for 4-5 days and takes ibuprofen as needed.  She is concerned with her vaginal bleeding. She reports feeling pressure in her vagina this morning and passing a clot. The pad she was wearing was halfway soaked. Vaginal pressure resolved after passing clot. She has bright red vaginal spotting now. She does not feel that vaginal bleeding is steady and is not soaking through a pad. She reports some mild abdominal pain which she has had, no cramping. No fever or chills. Patient states " I understand that some spotting is normal during recovery but I just wanted to let the doctor know." [name], NP notified. Advised patient to monitor bleeding and notify our office if bleeding increases. Instructed that RN will call and check in with her this afternoon. Patient verbalized understanding.

## 2021-09-20 ENCOUNTER — Telehealth: Payer: Self-pay

## 2021-09-20 NOTE — Telephone Encounter (Signed)
Attempted to reach Alyssa Oliver this afternoon to follow up with her recent symptoms. No answer. Left VM and stated for pt to return call to our office.

## 2021-09-28 ENCOUNTER — Other Ambulatory Visit: Payer: Self-pay

## 2021-09-28 ENCOUNTER — Encounter: Payer: Self-pay | Admitting: Gynecologic Oncology

## 2021-09-28 ENCOUNTER — Inpatient Hospital Stay (HOSPITAL_BASED_OUTPATIENT_CLINIC_OR_DEPARTMENT_OTHER): Payer: No Typology Code available for payment source | Admitting: Gynecologic Oncology

## 2021-09-28 VITALS — BP 151/83 | HR 101 | Temp 98.4°F | Resp 20 | Wt 246.2 lb

## 2021-09-28 DIAGNOSIS — N9489 Other specified conditions associated with female genital organs and menstrual cycle: Secondary | ICD-10-CM

## 2021-09-28 DIAGNOSIS — Z90710 Acquired absence of both cervix and uterus: Secondary | ICD-10-CM

## 2021-09-28 DIAGNOSIS — N939 Abnormal uterine and vaginal bleeding, unspecified: Secondary | ICD-10-CM

## 2021-09-28 DIAGNOSIS — Z9079 Acquired absence of other genital organ(s): Secondary | ICD-10-CM

## 2021-09-28 DIAGNOSIS — K5900 Constipation, unspecified: Secondary | ICD-10-CM

## 2021-09-28 DIAGNOSIS — D250 Submucous leiomyoma of uterus: Secondary | ICD-10-CM

## 2021-09-28 NOTE — Progress Notes (Signed)
Gynecologic Oncology Return Clinic Visit  09/28/2021  Reason for Visit: Follow-up after recent surgery  Treatment History: 09/06/2021: Robotic assisted TLH with bilateral salpingectomy, mini lap for specimen delivery, cystoscopy.  Interval History: Presents today for follow-up after surgery.  Notes overall continues to improve daily from a postoperative standpoint.  Is using Tylenol and ibuprofen for pain.  Notes pain is occasional.  Bowel function has returned although not completely back to normal.  Is using Senokot as needed as she felt like she was verging on diarrhea when taking it regularly.  Denies any bladder symptoms.  Endorses a good appetite without nausea or emesis.  Had 1 episode of heavier bleeding like the beginning of a period after surgery.  Otherwise, she notes mild spotting daily, and describes this as seeing some blood on a pad that she wears.  She thinks she may have had some vaginal discharge 1 day since surgery.  Past Medical/Surgical History: Past Medical History:  Diagnosis Date   Acute appendicitis 03/17/2019   Adnexal mass    Diabetes mellitus without complication ([place])    Fibroid uterus    Headache    Hypertension    Thyroid disease     Past Surgical History:  Procedure Laterality Date   APPENDECTOMY  03/17/2019   CESAREAN SECTION     LAPAROSCOPIC APPENDECTOMY N/A 03/17/2019   Procedure: APPENDECTOMY LAPAROSCOPIC;  Surgeon: Coralie Keens, MD;  Location: [facility];  Service: General;  Laterality: N/A;   ROBOTIC ASSISTED LAPAROSCOPIC HYSTERECTOMY AND SALPINGECTOMY N/A 09/06/2021   Procedure: XI ROBOTIC ASSISTED LAPAROSCOPIC HYSTERECTOMY GREATER THAN TWO HUNDRED AND FIFTY GRAMS, [place], CYSTOSCOPY;  Surgeon: Lafonda Mosses, MD;  Location: [facility];  Service: Gynecology;  Laterality: N/A;    Family History  Problem Relation Age of Onset   Colon cancer Neg Hx    Breast cancer Neg Hx    Ovarian cancer  Neg Hx    Endometrial cancer Neg Hx    Pancreatic cancer Neg Hx    Prostate cancer Neg Hx     Social History   Socioeconomic History   Marital status: Married    Spouse name: Not on file   Number of children: Not on file   Years of education: Not on file   Highest education level: Not on file  Occupational History   Not on file  Tobacco Use   Smoking status: Never   Smokeless tobacco: Never  Vaping Use   Vaping Use: Never used  Substance and Sexual Activity   Alcohol use: Not Currently   Drug use: Never   Sexual activity: Not Currently    Birth control/protection: None  Other Topics Concern   Not on file  Social History Narrative   Not on file   Social Determinants of Health   Financial Resource Strain: Not on file  Food Insecurity: Not on file  Transportation Needs: Not on file  Physical Activity: Not on file  Stress: Not on file  Social Connections: Not on file    Current Medications:  Current Outpatient Medications:    Ferrous Fumarate (FERROCITE PO), Take 1 tablet by mouth daily., Disp: , Rfl:    hydrochlorothiazide (HYDRODIURIL) 12.5 MG tablet, Take 12.5 mg by mouth daily., Disp: , Rfl:    ibuprofen (ADVIL) 800 MG tablet, Take 1 tablet (800 mg total) by mouth every 8 (eight) hours as needed for moderate pain. For AFTER surgery only, Disp: 30 tablet, Rfl: 0   levothyroxine (SYNTHROID, LEVOTHROID) 50 MCG  tablet, Take 50 mcg by mouth daily before breakfast., Disp: , Rfl:    oxyCODONE (OXY IR/ROXICODONE) 5 MG immediate release tablet, Take 1 tablet (5 mg total) by mouth every 4 (four) hours as needed for severe pain. For AFTER surgery, do not take and drive, Disp: 10 tablet, Rfl: 0   senna-docusate (SENOKOT-S) 8.6-50 MG tablet, Take 2 tablets by mouth at bedtime. For AFTER surgery, do not take if having diarrhea, Disp: 30 tablet, Rfl: 0   vitamin C (ASCORBIC ACID) 500 MG tablet, Take 500 mg by mouth daily., Disp: , Rfl:    ammonium lactate (LAC-HYDRIN) 12 % lotion,  Apply 1 application topically as needed for dry skin. (Patient not taking: Reported on 09/27/2021), Disp: , Rfl:    Multiple Vitamins-Minerals (MULTIVITAMIN WITH MINERALS) tablet, Take 1 tablet by mouth daily. (Patient not taking: Reported on 09/27/2021), Disp: , Rfl:    potassium chloride SA (KLOR-CON M) 20 MEQ tablet, Take 1 tablet (20 mEq total) by mouth 2 (two) times daily for 2 doses., Disp: 2 tablet, Rfl: 0   tranexamic acid (LYSTEDA) 650 MG TABS tablet, TAKE 2 TABLET(S) 3 TIMES A DAY BY ORAL ROUTE AS DIRECTED FOR 5 DAYS. (Patient not taking: Reported on 09/27/2021), Disp: , Rfl:   Review of Systems: Pertinent positives as per HPI. Denies appetite changes, fevers, chills, fatigue, unexplained weight changes. Denies hearing loss, neck lumps or masses, mouth sores, ringing in ears or voice changes. Denies cough or wheezing.  Denies shortness of breath. Denies chest pain or palpitations. Denies leg swelling. Denies abdominal distention, blood in stools, diarrhea, nausea, vomiting, or early satiety. Denies pain with intercourse, dysuria, frequency, hematuria or incontinence. Denies hot flashes, pelvic pain.   Denies joint pain, back pain or muscle pain/cramps. Denies itching, rash, or wounds. Denies dizziness, headaches, numbness or seizures. Denies swollen lymph nodes or glands, denies easy bruising or bleeding. Denies anxiety, depression, confusion, or decreased concentration.  Physical Exam: BP (!) 151/83    Pulse (!) 101    Temp 98.4 F (36.9 C)    Resp 20    Wt 246 lb 3.2 oz (111.7 kg)    SpO2 100%    BMI 40.97 kg/m  General: Alert, oriented, no acute distress. HEENT: Normocephalic, atraumatic, sclera anicteric. Chest: Clear to auscultation bilaterally.  Unlabored breathing on room air. Abdomen: Obese, soft, nontender.  Normoactive bowel sounds.  No masses or hepatosplenomegaly appreciated.  Well-healed incisions, remaining Dermabond removed, no erythema or induration. Extremities:  Grossly normal range of motion.  Warm, well perfused.  No edema bilaterally. Skin: No rashes or lesions noted. GU: Normal appearing external genitalia without erythema, excoriation, or lesions.  Speculum exam reveals minimal blood within the vaginal vault, suture line intact, no active bleeding noted.  Bimanual exam reveals cuff intact, no fluctuance or tenderness.    Laboratory & Radiologic Studies: A. UTERUS, CERVIX, BILATERAL FALLOPIAN TUBES, HYSTERECTOMY AND  SALPINGECTOMY:  - Cervix      Unremarkable.      No evidence of dysplasia.  - Endometrium      Proliferative.      No hyperplasia or malignancy.  - Myometrium      Extensive adenomyosis.      Leiomyomata.      No evidence of malignancy.  - Uterine serosa      Focal subserosal endometriosis.      No evidence of malignancy.  - Bilateral Fallopian tubes      Unremarkable.      No endometriosis or  malignancy.   B. UTERINE FIBROID:  - Leiomyoma.  - No evidence of malignancy.   Assessment & Plan: Alyssa Oliver is a 45 y.o. woman who is approximately 3 weeks status post robotic total hysterectomy with bilateral salpingectomy and mini lap for specimen delivery in the setting of large fibroid uterus.  Patient is overall doing well and meeting postoperative milestones.  Discussed continued expectations as well as limitations.  On exam, she has minimal blood in the vaginal vault.  I do not see anything that is actively bleeding.  Sutures are still in place.  I suspect that she may have had a postoperative hematoma at the cuff that has been slowly draining.  No fluctuance or significant tenderness to suggest infection.  I have given her precautions regarding infectious symptoms and asked her to call me if she develops any of this.  I would expect that the bleeding will decrease and ultimately stopped.  We will plan to see her back in approximately 3 weeks before she is released back to work for a visit and exam.  Encouraged her to try a  stool softener as this may be more gentle than taking the Senokot to help with her mild constipation.  Patient was given a copy of her final pathology report, which we reviewed together.  We discussed both the diagnosis of fibroids but also adenomyosis.  I also reviewed with her the pictures that I took at the time of surgery.  All questions answered.  I stressed to the patient that her ovaries are still in place and that she should go through menopause naturally.  32 minutes of total time was spent for this patient encounter, including preparation, face-to-face counseling with the patient and coordination of care, and documentation of the encounter.  Jeral Pinch, MD  Division of Gynecologic Oncology  Department of Obstetrics and Gynecology  [facility]

## 2021-09-28 NOTE — Patient Instructions (Addendum)
It was good to see you today.  You are healing great from surgery!    I will see you back right before you go back to work to assure that your bleeding has stopped.  If you have any other symptoms or start having heavier bleeding, please call me immediately.  As we reviewed today, your uterus, cervix, and fallopian tubes were removed.  There was no cancer or precancer found, just fibroids and adenomyosis.  Your ovaries are still inside which means you will go through menopause naturally.

## 2021-10-19 ENCOUNTER — Ambulatory Visit: Payer: No Typology Code available for payment source | Admitting: Gynecologic Oncology

## 2021-10-19 DIAGNOSIS — N9489 Other specified conditions associated with female genital organs and menstrual cycle: Secondary | ICD-10-CM

## 2021-11-14 ENCOUNTER — Telehealth: Payer: Self-pay | Admitting: *Deleted

## 2021-11-14 NOTE — Telephone Encounter (Signed)
Per request from [facility] fax last office note

## 2021-12-28 ENCOUNTER — Telehealth: Payer: Self-pay | Admitting: *Deleted

## 2021-12-28 NOTE — Telephone Encounter (Signed)
Spoke with pt this morning. Pt stated that her OBGYN office called her stating that we called them trying to get in touch with pt. Informed pt that we do not have a note stating that we called them. Pt stated that she has sensitive pinching pain in the area above and around her belly button that was the lap sites. It's only sensitive when her pants wraps around the area so she has to pull her pants down off the area. She denies any fevers, chills, bulging of the area or any vaginal bleeding. She scheduled a follow up with Dr.Tucker on 01/07/22 at 3:45pm.  ?

## 2022-01-04 ENCOUNTER — Encounter (HOSPITAL_COMMUNITY): Payer: Self-pay

## 2022-01-04 ENCOUNTER — Ambulatory Visit (HOSPITAL_COMMUNITY)
Admission: EM | Admit: 2022-01-04 | Discharge: 2022-01-04 | Disposition: A | Discharge status: Home / Self Care | Payer: No Typology Code available for payment source | Attending: Physician Assistant | Admitting: Physician Assistant

## 2022-01-04 DIAGNOSIS — R1310 Dysphagia, unspecified: Secondary | ICD-10-CM | POA: Diagnosis present

## 2022-01-04 DIAGNOSIS — J029 Acute pharyngitis, unspecified: Secondary | ICD-10-CM | POA: Insufficient documentation

## 2022-01-04 DIAGNOSIS — E039 Hypothyroidism, unspecified: Secondary | ICD-10-CM | POA: Diagnosis present

## 2022-01-04 DIAGNOSIS — M542 Cervicalgia: Secondary | ICD-10-CM | POA: Diagnosis present

## 2022-01-04 LAB — TSH: TSH: 5.368 u[IU]/mL — ABNORMAL HIGH (ref 0.350–4.500)

## 2022-01-04 LAB — POCT RAPID STREP A, ED / UC: Streptococcus, Group A Screen (Direct): NEGATIVE

## 2022-01-04 LAB — POCT INFECTIOUS MONO SCREEN, ED / UC: Mono Screen: NEGATIVE

## 2022-01-04 MED ORDER — LEVOTHYROXINE SODIUM 50 MCG PO TABS
50.0000 ug | ORAL_TABLET | Freq: Every day | ORAL | 1 refills | Status: DC
Start: 2022-01-04 — End: 2022-08-12

## 2022-01-04 MED ORDER — METHOCARBAMOL 500 MG PO TABS: 500.0000 mg | ORAL_TABLET | Freq: Two times a day (BID) | ORAL | 0 refills | Status: DC

## 2022-01-04 MED ORDER — PREDNISONE 20 MG PO TABS: 20.0000 mg | ORAL_TABLET | Freq: Every day | ORAL | 0 refills | Status: AC

## 2022-01-04 NOTE — ED Triage Notes (Signed)
Pt reports chest pain x 1 week and states it went away. Pt states she started having a sore throat and neck pain.  ?

## 2022-01-04 NOTE — ED Provider Notes (Signed)
?[facility] ? ? ? ?CSN: [id number] ?Arrival date & time: 01/04/22  1205 ? ? ?  ? ?History   ?Chief Complaint ?Chief Complaint  ?Patient presents with  ? Sore Throat  ? Chest Pain  ? ? ?HPI ?Alyssa Oliver is a 46 y.o. female.  ? ?Patient presents today with a several day history of sore throat pain.  Reports that initially approximately week ago she had a sensation of pressure in her chest which resolved with MiraLAX and over-the-counter medications.  She reports that this discomfort has completely resolved but she is now experiencing a severe sore throat.  She reports discomfort that goes around her throat into her neck.  She has been taking ibuprofen and amoxicillin as well as using a oil from her [place] which has provided minimal relief of symptoms.  She reports difficulty swallowing due to pain but is eating and drinking.  She denies any swelling of her throat, shortness of breath, muffled voice.  Denies any known sick contacts.  She is confident she is not pregnant.  She reports pain is rated 4 on a 0-10 pain scale, described as aching, no aggravating or alleviating factors identified. ? ? ?Past Medical History:  ?Diagnosis Date  ? Acute appendicitis 03/17/2019  ? Adnexal mass   ? Diabetes mellitus without complication ([place])   ? Fibroid uterus   ? Headache   ? Hypertension   ? Thyroid disease   ? ? ?Patient Active Problem List  ? Diagnosis Date Noted  ? Acute post-operative pain 09/14/2021  ? Uterine leiomyoma   ? Edema of left lower extremity 09/04/2021  ? Abnormal uterine bleeding (AUB) 07/30/2021  ? Adnexal mass 07/30/2021  ? BMI 40.0-44.9, adult ([place]) 07/30/2021  ? Trochanteric bursitis of left hip 09/14/2018  ? Pain of left hip joint 09/14/2018  ? ? ?Past Surgical History:  ?Procedure Laterality Date  ? APPENDECTOMY  03/17/2019  ? CESAREAN SECTION    ? LAPAROSCOPIC APPENDECTOMY N/A 03/17/2019  ? Procedure: APPENDECTOMY LAPAROSCOPIC;  Surgeon: Coralie Keens, MD;  Location: [facility];  Service:  General;  Laterality: N/A;  ? ROBOTIC ASSISTED LAPAROSCOPIC HYSTERECTOMY AND SALPINGECTOMY N/A 09/06/2021  ? Procedure: XI ROBOTIC ASSISTED LAPAROSCOPIC HYSTERECTOMY GREATER THAN TWO HUNDRED AND FIFTY GRAMS, BILATERAL SALPINGECTOMY,MINI LAPAROTOMY FOR SPECIMEN REMOVAL, CYSTOSCOPY;  Surgeon: Lafonda Mosses, MD;  Location: [facility];  Service: Gynecology;  Laterality: N/A;  ? ? ?OB History   ? ? Gravida  ?3  ? Para  ?2  ? Term  ?   ? Preterm  ?   ? AB  ?1  ? Living  ?   ?  ? ? SAB  ?   ? IAB  ?   ? Ectopic  ?   ? Multiple  ?   ? Live Births  ?   ?   ?  ?  ? ? ? ?Home Medications   ? ?Prior to Admission medications   ?Medication Sig Start Date End Date Taking? Authorizing Provider  ?methocarbamol (ROBAXIN) 500 MG tablet Take 1 tablet (500 mg total) by mouth 2 (two) times daily. 01/04/22  Yes Ambrose Wile K, PA-C  ?predniSONE (DELTASONE) 20 MG tablet Take 1 tablet (20 mg total) by mouth daily for 3 days. 01/04/22 01/07/22 Yes Zarius Furr K, PA-C  ?ammonium lactate (LAC-HYDRIN) 12 % lotion Apply 1 application topically as needed for dry skin. ?Patient not taking: Reported on 09/27/2021 01/07/20   [provider]  ?Ferrous Fumarate (FERROCITE PO) Take 1 tablet  by mouth daily.    [provider]  ?hydrochlorothiazide (HYDRODIURIL) 12.5 MG tablet Take 12.5 mg by mouth daily. 06/13/21   [provider]  ?ibuprofen (ADVIL) 800 MG tablet Take 1 tablet (800 mg total) by mouth every 8 (eight) hours as needed for moderate pain. For AFTER surgery only 09/03/21   [name] D, NP  ?levothyroxine (SYNTHROID) 50 MCG tablet Take 1 tablet (50 mcg total) by mouth daily before breakfast. 01/04/22   Naara Kelty, Derry Skill, PA-C  ?Multiple Vitamins-Minerals (MULTIVITAMIN WITH MINERALS) tablet Take 1 tablet by mouth daily. ?Patient not taking: Reported on 09/27/2021    [provider]  ?oxyCODONE (OXY IR/ROXICODONE) 5 MG immediate release tablet Take 1 tablet (5 mg total) by mouth every 4 (four) hours as needed for  severe pain. For AFTER surgery, do not take and drive 09[birth year]   [name] D, NP  ?potassium chloride SA (KLOR-CON M) 20 MEQ tablet Take 1 tablet (20 mEq total) by mouth 2 (two) times daily for 2 doses. 09/03/21 09/06/21  [name] D, NP  ?senna-docusate (SENOKOT-S) 8.6-50 MG tablet Take 2 tablets by mouth at bedtime. For AFTER surgery, do not take if having diarrhea 09/03/21   [name] D, NP  ?tranexamic acid (LYSTEDA) 650 MG TABS tablet TAKE 2 TABLET(S) 3 TIMES A DAY BY ORAL ROUTE AS DIRECTED FOR 5 DAYS. ?Patient not taking: Reported on 09/27/2021 01/03/20   [provider]  ?vitamin C (ASCORBIC ACID) 500 MG tablet Take 500 mg by mouth daily.    [provider]  ? ? ?Family History ?Family History  ?Problem Relation Age of Onset  ? Colon cancer Neg Hx   ? Breast cancer Neg Hx   ? Ovarian cancer Neg Hx   ? Endometrial cancer Neg Hx   ? Pancreatic cancer Neg Hx   ? Prostate cancer Neg Hx   ? ? ?Social History ?Social History  ? ?Tobacco Use  ? Smoking status: Never  ? Smokeless tobacco: Never  ?Vaping Use  ? Vaping Use: Never used  ?Substance Use Topics  ? Alcohol use: Not Currently  ? Drug use: Never  ? ? ? ?Allergies   ?Penicillins and Sulfa antibiotics ? ? ?Review of Systems ?Review of Systems  ?Constitutional:  Positive for activity change. Negative for appetite change, fatigue and fever.  ?HENT:  Positive for sore throat and trouble swallowing. Negative for congestion, sinus pressure, sneezing and voice change.   ?Respiratory:  Negative for cough and shortness of breath.   ?Cardiovascular:  Negative for chest pain.  ?Gastrointestinal:  Negative for abdominal pain, diarrhea, nausea and vomiting.  ?Neurological:  Negative for dizziness, light-headedness and headaches.  ? ? ?Physical Exam ?Triage Vital Signs ?ED Triage Vitals  ?Enc Vitals Group  ?   BP 01/04/22 1239 117/61  ?   Pulse Rate 01/04/22 1238 71  ?   Resp 01/04/22 1238 17  ?   Temp 01/04/22 1238 98.5 ?F (36.9 ?C)  ?   Temp  Source 01/04/22 1238 Oral  ?   SpO2 01/04/22 1238 98 %  ?   Weight --   ?   Height --   ?   Head Circumference --   ?   Peak Flow --   ?   Pain Score 01/04/22 1237 4  ?   Pain Loc --   ?   Pain Edu? --   ?   Excl. in [place]? --   ? ?No data found. ? ?Updated Vital  Signs ?BP 117/61 (BP Location: Left Arm)   Pulse 71   Temp 98.5 ?F (36.9 ?C) (Oral)   Resp 17   LMP 08/24/2021 (Exact Date)   SpO2 98%  ? ?Visual Acuity ?Right Eye Distance:   ?Left Eye Distance:   ?Bilateral Distance:   ? ?Right Eye Near:   ?Left Eye Near:    ?Bilateral Near:    ? ?Physical Exam ?Vitals reviewed.  ?Constitutional:   ?   General: She is awake. She is not in acute distress. ?   Appearance: Normal appearance. She is well-developed. She is not ill-appearing.  ?   Comments: Very pleasant female appears stated in no acute distress  ?HENT:  ?   Head: Normocephalic and atraumatic.  ?   Right Ear: Tympanic membrane, ear canal and external ear normal. Tympanic membrane is not erythematous or bulging.  ?   Left Ear: Tympanic membrane, ear canal and external ear normal. Tympanic membrane is not erythematous or bulging.  ?   Nose:  ?   Right Sinus: No maxillary sinus tenderness or frontal sinus tenderness.  ?   Left Sinus: No maxillary sinus tenderness or frontal sinus tenderness.  ?   Mouth/Throat:  ?   Pharynx: Uvula midline. Posterior oropharyngeal erythema present. No oropharyngeal exudate.  ?   Tonsils: No tonsillar exudate or tonsillar abscesses.  ?Cardiovascular:  ?   Rate and Rhythm: Normal rate and regular rhythm.  ?   Heart sounds: Normal heart sounds, S1 normal and S2 normal. No murmur heard. ?Pulmonary:  ?   Effort: Pulmonary effort is normal.  ?   Breath sounds: Normal breath sounds. No wheezing, rhonchi or rales.  ?   Comments: Clear to auscultation bilaterally ?Lymphadenopathy:  ?   Head:  ?   Right side of head: Submandibular adenopathy present. No submental or tonsillar adenopathy.  ?   Left side of head: Submandibular adenopathy  present. No submental or tonsillar adenopathy.  ?   Cervical: No cervical adenopathy.  ?Psychiatric:     ?   Behavior: Behavior is cooperative.  ? ? ? ?UC Treatments / Results  ?Labs ?(all labs ordered are listed,

## 2022-01-04 NOTE — Discharge Instructions (Addendum)
Your strep and mono testing were negative.  We will contact you if your throat culture is positive.  Please stop the amoxicillin you have been taking.  I have called in prednisone to help with the pain and swelling.  Do not take NSAIDs including aspirin, ibuprofen/Advil, naproxen/Aleve with this medication.  You can use Tylenol for breakthrough pain.  Gargle with warm salt water for symptom relief.  If anything worsens and you have difficulty speaking, difficulty swallowing, shortness of breath, swelling of your throat you need to be seen immediately.  If symptoms not improved by next week please follow-up with the ENT; call to schedule an appointment. ? ?You can use methocarbamol for muscle pain relief.  This can make you sleepy so do not drive or drink alcohol with taking it. ? ?We will contact you if we need to adjust your dose of levothyroxine.  I have sent a refill of your current dose to the pharmacy.  Please follow-up with your PCP for additional refills. ?

## 2022-01-07 ENCOUNTER — Other Ambulatory Visit: Payer: Self-pay

## 2022-01-07 ENCOUNTER — Inpatient Hospital Stay: Payer: No Typology Code available for payment source | Attending: Gynecologic Oncology | Admitting: Gynecologic Oncology

## 2022-01-07 ENCOUNTER — Encounter: Payer: Self-pay | Admitting: Gynecologic Oncology

## 2022-01-07 VITALS — BP 152/82 | HR 87 | Temp 98.5°F | Resp 18 | Ht 63.98 in | Wt 253.0 lb

## 2022-01-07 DIAGNOSIS — Z86018 Personal history of other benign neoplasm: Secondary | ICD-10-CM | POA: Insufficient documentation

## 2022-01-07 DIAGNOSIS — N9489 Other specified conditions associated with female genital organs and menstrual cycle: Secondary | ICD-10-CM

## 2022-01-07 DIAGNOSIS — N898 Other specified noninflammatory disorders of vagina: Secondary | ICD-10-CM

## 2022-01-07 DIAGNOSIS — Z90722 Acquired absence of ovaries, bilateral: Secondary | ICD-10-CM | POA: Diagnosis not present

## 2022-01-07 DIAGNOSIS — Z90710 Acquired absence of both cervix and uterus: Secondary | ICD-10-CM | POA: Diagnosis not present

## 2022-01-07 LAB — CULTURE, GROUP A STREP (THRC)

## 2022-01-07 NOTE — Patient Instructions (Signed)
It was good to see you today.  You have healed really well from surgery.  I might try some gentle massage with either lotion or vitamin E over the larger incision. ? ?I do not appreciate increased or odor to your discharge on exam today.  I think that douching can often cause more issues with that because it changes the flora of your vagina.  I talked to your OB/GYN about suggestions to help rebalance your vaginal flora. ?

## 2022-01-07 NOTE — Progress Notes (Signed)
Gynecologic Oncology Return Clinic Visit ? ?01/07/22 ? ?Reason for Visit: Follow-up after robotic hysterectomy for fibroid uterus ? ?Treatment History: ?09/06/2021: Robotic assisted TLH with bilateral salpingectomy, mini lap for specimen delivery, cystoscopy. ? ?Interval History: ?Patient presents today after I reached out to her OB/GYN.  She admits to her second follow-up appointment with me.  She notes overall doing well.  Reports baseline bowel and bladder function.  Denies any vaginal bleeding.  Has had some slight increase in discharge which at times has an odor.  Has been using Burnell Blanks with some improvement.  Has some tenderness around the skin of her mini lap incision.  Occasionally feels pinpricks at the sites of her other incisions.  Denies any intra-abdominal or pelvic pain. ? ?Past Medical/Surgical History: ?Past Medical History:  ?Diagnosis Date  ? Acute appendicitis 03/17/2019  ? Adnexal mass   ? Diabetes mellitus without complication ([place])   ? Fibroid uterus   ? Headache   ? Hypertension   ? Thyroid disease   ? ? ?Past Surgical History:  ?Procedure Laterality Date  ? APPENDECTOMY  03/17/2019  ? CESAREAN SECTION    ? LAPAROSCOPIC APPENDECTOMY N/A 03/17/2019  ? Procedure: APPENDECTOMY LAPAROSCOPIC;  Surgeon: Coralie Keens, MD;  Location: [facility];  Service: General;  Laterality: N/A;  ? ROBOTIC ASSISTED LAPAROSCOPIC HYSTERECTOMY AND SALPINGECTOMY N/A 09/06/2021  ? Procedure: XI ROBOTIC ASSISTED LAPAROSCOPIC HYSTERECTOMY GREATER THAN TWO HUNDRED AND FIFTY GRAMS, BILATERAL SALPINGECTOMY,MINI LAPAROTOMY FOR SPECIMEN REMOVAL, CYSTOSCOPY;  Surgeon: Lafonda Mosses, MD;  Location: [facility];  Service: Gynecology;  Laterality: N/A;  ? ? ?Family History  ?Problem Relation Age of Onset  ? Colon cancer Neg Hx   ? Breast cancer Neg Hx   ? Ovarian cancer Neg Hx   ? Endometrial cancer Neg Hx   ? Pancreatic cancer Neg Hx   ? Prostate cancer Neg Hx   ? ? ?Social History  ? ?Socioeconomic History  ? Marital status:  Married  ?  Spouse name: Not on file  ? Number of children: Not on file  ? Years of education: Not on file  ? Highest education level: Not on file  ?Occupational History  ? Not on file  ?Tobacco Use  ? Smoking status: Never  ? Smokeless tobacco: Never  ?Vaping Use  ? Vaping Use: Never used  ?Substance and Sexual Activity  ? Alcohol use: Not Currently  ? Drug use: Never  ? Sexual activity: Not Currently  ?  Birth control/protection: None  ?Other Topics Concern  ? Not on file  ?Social History Narrative  ? Not on file  ? ?Social Determinants of Health  ? ?Financial Resource Strain: Not on file  ?Food Insecurity: Not on file  ?Transportation Needs: Not on file  ?Physical Activity: Not on file  ?Stress: Not on file  ?Social Connections: Not on file  ? ? ?Current Medications: ? ?Current Outpatient Medications:  ?  ammonium lactate (LAC-HYDRIN) 12 % lotion, Apply 1 application topically as needed for dry skin. (Patient not taking: Reported on 09/27/2021), Disp: , Rfl:  ?  Ferrous Fumarate (FERROCITE PO), Take 1 tablet by mouth daily., Disp: , Rfl:  ?  hydrochlorothiazide (HYDRODIURIL) 12.5 MG tablet, Take 12.5 mg by mouth daily., Disp: , Rfl:  ?  ibuprofen (ADVIL) 800 MG tablet, Take 1 tablet (800 mg total) by mouth every 8 (eight) hours as needed for moderate pain. For AFTER surgery only, Disp: 30 tablet, Rfl: 0 ?  levothyroxine (SYNTHROID) 50 MCG tablet, Take 1  tablet (50 mcg total) by mouth daily before breakfast., Disp: 30 tablet, Rfl: 1 ?  methocarbamol (ROBAXIN) 500 MG tablet, Take 1 tablet (500 mg total) by mouth 2 (two) times daily., Disp: 20 tablet, Rfl: 0 ?  Multiple Vitamins-Minerals (MULTIVITAMIN WITH MINERALS) tablet, Take 1 tablet by mouth daily. (Patient not taking: Reported on 09/27/2021), Disp: , Rfl:  ?  oxyCODONE (OXY IR/ROXICODONE) 5 MG immediate release tablet, Take 1 tablet (5 mg total) by mouth every 4 (four) hours as needed for severe pain. For AFTER surgery, do not take and drive, Disp: 10 tablet,  Rfl: 0 ?  potassium chloride SA (KLOR-CON M) 20 MEQ tablet, Take 1 tablet (20 mEq total) by mouth 2 (two) times daily for 2 doses., Disp: 2 tablet, Rfl: 0 ?  predniSONE (DELTASONE) 20 MG tablet, Take 1 tablet (20 mg total) by mouth daily for 3 days., Disp: 3 tablet, Rfl: 0 ?  senna-docusate (SENOKOT-S) 8.6-50 MG tablet, Take 2 tablets by mouth at bedtime. For AFTER surgery, do not take if having diarrhea, Disp: 30 tablet, Rfl: 0 ?  tranexamic acid (LYSTEDA) 650 MG TABS tablet, TAKE 2 TABLET(S) 3 TIMES A DAY BY ORAL ROUTE AS DIRECTED FOR 5 DAYS. (Patient not taking: Reported on 09/27/2021), Disp: , Rfl:  ?  vitamin C (ASCORBIC ACID) 500 MG tablet, Take 500 mg by mouth daily., Disp: , Rfl:  ? ?Review of Systems: ?Denies appetite changes, fevers, chills, fatigue, unexplained weight changes. ?Denies hearing loss, neck lumps or masses, mouth sores, ringing in ears or voice changes. ?Denies cough or wheezing.  Denies shortness of breath. ?Denies chest pain or palpitations. Denies leg swelling. ?Denies abdominal distention, blood in stools, constipation, diarrhea, nausea, vomiting, or early satiety. ?Denies pain with intercourse, dysuria, frequency, hematuria or incontinence. ?Denies hot flashes, pelvic pain, vaginal bleeding.   ?Denies joint pain, back pain or muscle pain/cramps. ?Denies itching, rash, or wounds. ?Denies dizziness, headaches, numbness or seizures. ?Denies swollen lymph nodes or glands, denies easy bruising or bleeding. ?Denies anxiety, depression, confusion, or decreased concentration. ? ?Physical Exam: ?BP (!) 152/82 (BP Location: Left Arm, Patient Position: Sitting)   Pulse 87   Temp 98.5 ?F (36.9 ?C) (Oral)   Resp 18   Ht 5' 3.98" (1.625 m)   Wt 253 lb (114.8 kg)   LMP 08/24/2021 (Exact Date)   SpO2 99%   BMI 43.46 kg/m?  ?General: Alert, oriented, no acute distress. ?HEENT: Normocephalic, atraumatic, sclera anicteric. ?Chest: Unlabored breathing on room air. ?Abdomen: Obese, soft, nontender.   Well-healed incisions.  Minimal keloiding of her mini lap incision without significant tenderness or induration appreciated on exam.  Normoactive bowel sounds.  No masses or hepatosplenomegaly appreciated.  Well-healed laparoscopic incisions. ?Extremities: Grossly normal range of motion.  Warm, well perfused.  Compression socks in place. ?Skin: No rashes or lesions noted. ?GU: Normal appearing external genitalia without erythema, excoriation, or lesions.  Speculum exam reveals cuff intact, no suture visible, physiologic discharge noted, no odor appreciated.  Bimanual exam reveals cuff intact, no tenderness with palpation.   ? ?Laboratory & Radiologic Studies: ?None new ? ?Assessment & Plan: ?Alyssa Oliver is a 46 y.o. woman who underwent robotic total hysterectomy with bilateral salpingectomy and mini lap for specimen delivery in the setting of large fibroid uterus in early December presenting for follow-up. ? ?Patient is overall doing much better than when I saw her at the end of December.  She has had resolution of some of her lingering postoperative issues.  She has  some tenderness around the larger incision, which I suspect may be related to nerve damage.  I do not feel any evidence of scar tissue in the subcutaneous tissue and she does not have tenderness with palpation of this area.  I suggested that she could try some gentle massage to see if this helps. ? ?Her vaginal cuff has healed completely.  She notes some intermittent vaginal discharge and thinks that occasionally there is an odor.  I do not appreciate this on exam today.  She has been using a product to help with this.  I think that douching can sometimes change the natural flora of the vagina.  I have encouraged her to reach out to her OB/GYN for further counseling.  Boric acid suppositories can sometimes be helpful to rebalance the pH of the vagina. ? ?28 minutes of total time was spent for this patient encounter, including preparation, face-to-face  counseling with the patient and coordination of care, and documentation of the encounter. ? ?Jeral Pinch, MD  ?Division of Gynecologic Oncology  ?Department of Obstetrics and Gynecology  ?[place]

## 2022-04-15 DIAGNOSIS — D649 Anemia, unspecified: Secondary | ICD-10-CM | POA: Insufficient documentation

## 2022-06-14 ENCOUNTER — Ambulatory Visit (HOSPITAL_COMMUNITY)
Admission: EM | Admit: 2022-06-14 | Discharge: 2022-06-14 | Disposition: A | Discharge status: Home / Self Care | Payer: No Typology Code available for payment source | Attending: Family Medicine | Admitting: Family Medicine

## 2022-06-14 ENCOUNTER — Encounter (HOSPITAL_COMMUNITY): Payer: Self-pay

## 2022-06-14 DIAGNOSIS — R42 Dizziness and giddiness: Secondary | ICD-10-CM

## 2022-06-14 LAB — CBC
HCT: 41.6 % (ref 36.0–46.0)
Hemoglobin: 14.1 g/dL (ref 12.0–15.0)
MCH: 30 pg (ref 26.0–34.0)
MCHC: 33.9 g/dL (ref 30.0–36.0)
MCV: 88.5 fL (ref 80.0–100.0)
Platelets: 372 10*3/uL (ref 150–400)
RBC: 4.7 MIL/uL (ref 3.87–5.11)
RDW: 14.6 % (ref 11.5–15.5)
WBC: 6.2 10*3/uL (ref 4.0–10.5)
nRBC: 0 % (ref 0.0–0.2)

## 2022-06-14 LAB — BASIC METABOLIC PANEL
Anion gap: 10 (ref 5–15)
BUN: 9 mg/dL (ref 6–20)
CO2: 25 mmol/L (ref 22–32)
Calcium: 9.8 mg/dL (ref 8.9–10.3)
Chloride: 104 mmol/L (ref 98–111)
Creatinine, Ser: 0.63 mg/dL (ref 0.44–1.00)
GFR, Estimated: >60 mL/min (ref >60)
Glucose, Bld: 105 mg/dL — ABNORMAL HIGH (ref 70–99)
Potassium: 4 mmol/L (ref 3.5–5.1)
Sodium: 139 mmol/L (ref 135–145)

## 2022-06-14 LAB — HEMOGLOBIN A1C
Hgb A1c MFr Bld: 6.4 % — ABNORMAL HIGH (ref 4.8–5.6)
Mean Plasma Glucose: 136.98 mg/dL

## 2022-06-14 NOTE — ED Triage Notes (Signed)
Pt states her BP has been high for the past 10 days also states she has been dizzy for the past 3 days.  Pt is on HCTZ has not taken it today.

## 2022-06-14 NOTE — ED Provider Notes (Addendum)
[facility]    CSN: [id number] Arrival date & time: 06/14/22  0737      History   Chief Complaint Chief Complaint  Patient presents with   Hypertension    HPI Alyssa Oliver is a 46 y.o. female.    Hypertension    Here for palpitations and dizziness going on in the last couple of days.  About 4 to 5 days ago she drove back from [place], and was fatigued after that.  She noticed that she felt tired and off.  No vomiting or diarrhea.  2 days ago she began having some palpitations mainly when lying down, and sometimes she feels that she can hear her heartbeat in her ear when she lies down.  She has felt lightheaded some, and she checked her blood pressure last night and this morning and got something like 159/90-100.  She is already on hydrochlorothiazide, she thinks for hypertension.  She did not take it today  She asked [place] to check an A1c and sugar.  She states that her A1c was borderline previously  Past Medical History:  Diagnosis Date   Acute appendicitis 03/17/2019   Adnexal mass    Diabetes mellitus without complication ([place])    Fibroid uterus    Headache    Hypertension    Thyroid disease     Patient Active Problem List   Diagnosis Date Noted   Acute post-operative pain 09/14/2021   Uterine leiomyoma    Edema of left lower extremity 09/04/2021   Abnormal uterine bleeding (AUB) 07/30/2021   Adnexal mass 07/30/2021   BMI 40.0-44.9, adult ([place]) 07/30/2021   Trochanteric bursitis of left hip 09/14/2018   Pain of left hip joint 09/14/2018    Past Surgical History:  Procedure Laterality Date   APPENDECTOMY  03/17/2019   CESAREAN SECTION     LAPAROSCOPIC APPENDECTOMY N/A 03/17/2019   Procedure: APPENDECTOMY LAPAROSCOPIC;  Surgeon: Coralie Keens, MD;  Location: [facility];  Service: General;  Laterality: N/A;   ROBOTIC ASSISTED LAPAROSCOPIC HYSTERECTOMY AND SALPINGECTOMY N/A 09/06/2021   Procedure: XI ROBOTIC ASSISTED LAPAROSCOPIC [place], CYSTOSCOPY;  Surgeon: Lafonda Mosses, MD;  Location: [facility];  Service: Gynecology;  Laterality: N/A;    OB History     Gravida  3   Para  2   Term      Preterm      AB  1   Living         SAB      IAB      Ectopic      Multiple      Live Births               Home Medications    Prior to Admission medications   Medication Sig Start Date End Date Taking? Authorizing Provider  Ferrous Fumarate (FERROCITE PO) Take 1 tablet by mouth daily.    [provider]  hydrochlorothiazide (HYDRODIURIL) 12.5 MG tablet Take 12.5 mg by mouth daily. 06/13/21   [provider]  levothyroxine (SYNTHROID) 50 MCG tablet Take 1 tablet (50 mcg total) by mouth daily before breakfast. 01/04/22   Raspet, Junie Panning K, PA-C  potassium chloride SA (KLOR-CON M) 20 MEQ tablet Take 1 tablet (20 mEq total) by mouth 2 (two) times daily for 2 doses. 09/03/21 09/06/21  Cross, Lenna Sciara D, NP  senna-docusate (SENOKOT-S) 8.6-50 MG tablet Take 2 tablets by mouth at bedtime. For AFTER  surgery, do not take if having diarrhea 09/03/21   Cross, Lenna Sciara D, NP  vitamin C (ASCORBIC ACID) 500 MG tablet Take 500 mg by mouth daily.    [provider]    Family History Family History  Problem Relation Age of Onset   Colon cancer Neg Hx    Breast cancer Neg Hx    Ovarian cancer Neg Hx    Endometrial cancer Neg Hx    Pancreatic cancer Neg Hx    Prostate cancer Neg Hx     Social History Social History   Tobacco Use   Smoking status: Never   Smokeless tobacco: Never  Vaping Use   Vaping Use: Never used  Substance Use Topics   Alcohol use: Not Currently   Drug use: Never     Allergies   Penicillins and Sulfa antibiotics   Review of Systems Review of Systems   Physical Exam Triage Vital Signs ED Triage Vitals  Enc Vitals Group     BP 06/14/22 1128 (!) 140/62     Pulse Rate  06/14/22 1125 70     Resp 06/14/22 1125 16     Temp 06/14/22 1125 98.1 F (36.7 C)     Temp Source 06/14/22 1125 Oral     SpO2 06/14/22 1125 100 %     Weight --      Height --      Head Circumference --      Peak Flow --      Pain Score 06/14/22 1127 0     Pain Loc --      Pain Edu? --      Excl. in [place]? --    No data found.  Updated Vital Signs BP (!) 140/62 (BP Location: Left Arm)   Pulse 70   Temp 98.1 F (36.7 C) (Oral)   Resp 16   LMP 08/24/2021 (Exact Date)   SpO2 100%   Visual Acuity Right Eye Distance:   Left Eye Distance:   Bilateral Distance:    Right Eye Near:   Left Eye Near:    Bilateral Near:     Physical Exam Vitals reviewed.  Constitutional:      General: She is not in acute distress.    Appearance: She is not toxic-appearing.  Cardiovascular:     Rate and Rhythm: Normal rate and regular rhythm.     Heart sounds: No murmur heard. Pulmonary:     Effort: Pulmonary effort is normal.     Breath sounds: No stridor. No wheezing, rhonchi or rales.  Musculoskeletal:     Cervical back: Neck supple.     Right lower leg: No edema.     Left lower leg: No edema.  Lymphadenopathy:     Cervical: No cervical adenopathy.  Skin:    Coloration: Skin is not jaundiced or pale.  Neurological:     Mental Status: She is alert and oriented to person, place, and time.  Psychiatric:        Behavior: Behavior normal.      UC Treatments / Results  Labs (all labs ordered are listed, but only abnormal results are displayed) Labs Reviewed  CBC  BASIC METABOLIC PANEL  HEMOGLOBIN A1C    EKG   Radiology No results found.  Procedures Procedures (including critical care time)  Medications Ordered in UC Medications - No data to display  Initial Impression / Assessment and Plan / UC Course  I have reviewed the triage vital signs and the nursing notes.  Pertinent labs & imaging results that were available during my care of the patient were reviewed by me  and considered in my medical decision making (see chart for details).       EKG shows normal sinus rhythm without arrhythmia or any other abnormality.  Lab is drawn today for CBC, basic metabolic profile, and S9P.  Rest and increase fluids for the next 24 hours.   When I went to give her results of her EKG and tell her the plan, she then stated she had trouble in her neck and in her arms with some pain and sometimes some numbness in her hands.  The symptoms are not overwhelming, and she has been taking aspirin for it.  I discussed with her to take Tylenol instead, use a heating pad and do some neck rolls and stretches.  I have also given her instructions on how to self schedule for a new patient appointment Final Clinical Impressions(s) / UC Diagnoses   Final diagnoses:  Dizziness     Discharge Instructions      Your EKG was normal We have drawn blood to check your blood counts, your sugar and A1c, and electrolytes.  Staff will notify you if there is anything significantly abnormal that needs treatment.  You can use the QR code/website at the back of the summary paperwork to schedule yourself a new patient appointment with primary care  Rest and make sure you are drinking plenty of fluids today     ED Prescriptions   None    PDMP not reviewed this encounter.   Barrett Henle, MD 06/14/22 1214    Barrett Henle, MD 06/14/22 [phone]

## 2022-06-14 NOTE — Discharge Instructions (Addendum)
Your EKG was normal We have drawn blood to check your blood counts, your sugar and A1c, and electrolytes.  Staff will notify you if there is anything significantly abnormal that needs treatment.  You can use the QR code/website at the back of the summary paperwork to schedule yourself a new patient appointment with primary care  Rest and make sure you are drinking plenty of fluids today

## 2022-08-12 ENCOUNTER — Encounter: Payer: Self-pay | Admitting: Internal Medicine

## 2022-08-12 ENCOUNTER — Other Ambulatory Visit (HOSPITAL_COMMUNITY): Payer: Self-pay

## 2022-08-12 ENCOUNTER — Ambulatory Visit (INDEPENDENT_AMBULATORY_CARE_PROVIDER_SITE_OTHER): Payer: No Typology Code available for payment source | Admitting: Internal Medicine

## 2022-08-12 VITALS — BP 136/68 | HR 86 | Temp 98.6°F | Ht 63.0 in | Wt 259.8 lb

## 2022-08-12 DIAGNOSIS — Z87898 Personal history of other specified conditions: Secondary | ICD-10-CM

## 2022-08-12 DIAGNOSIS — E039 Hypothyroidism, unspecified: Secondary | ICD-10-CM

## 2022-08-12 DIAGNOSIS — M542 Cervicalgia: Secondary | ICD-10-CM

## 2022-08-12 DIAGNOSIS — Z6841 Body Mass Index (BMI) 40.0 and over, adult: Secondary | ICD-10-CM

## 2022-08-12 DIAGNOSIS — R7303 Prediabetes: Secondary | ICD-10-CM | POA: Diagnosis not present

## 2022-08-12 DIAGNOSIS — Z2821 Immunization not carried out because of patient refusal: Secondary | ICD-10-CM

## 2022-08-12 DIAGNOSIS — I10 Essential (primary) hypertension: Secondary | ICD-10-CM

## 2022-08-12 DIAGNOSIS — Z7689 Persons encountering health services in other specified circumstances: Secondary | ICD-10-CM

## 2022-08-12 LAB — POCT URINALYSIS DIPSTICK
Bilirubin, UA: NEGATIVE
Glucose, UA: NEGATIVE
Ketones, UA: NEGATIVE
Leukocytes, UA: NEGATIVE
Nitrite, UA: NEGATIVE
Protein, UA: POSITIVE — AB
Spec Grav, UA: 1.02 (ref 1.010–1.025)
Urobilinogen, UA: 0.2 E.U./dL
pH, UA: 7 (ref 5.0–8.0)

## 2022-08-12 MED ORDER — SEMAGLUTIDE-WEIGHT MANAGEMENT 1 MG/0.5ML ~~LOC~~ SOAJ
1.0000 mg | SUBCUTANEOUS | 0 refills | Status: AC
  Filled 2022-08-12: qty 2, 28d supply, fill #0

## 2022-08-12 MED ORDER — LEVOTHYROXINE SODIUM 75 MCG PO TABS: 75.0000 ug | ORAL_TABLET | Freq: Every day | ORAL | 11 refills | Status: DC

## 2022-08-12 NOTE — Patient Instructions (Addendum)
DASH Eating Plan DASH stands for Dietary Approaches to Stop Hypertension. The DASH eating plan is a healthy eating plan that has been shown to: Reduce high blood pressure (hypertension). Reduce your risk for type 2 diabetes, heart disease, and stroke. Help with weight loss. What are tips for following this plan? Reading food labels Check food labels for the amount of salt (sodium) per serving. Choose foods with less than 5 percent of the Daily Value of sodium. Generally, foods with less than 300 milligrams (mg) of sodium per serving fit into this eating plan. To find whole grains, look for the word "whole" as the first word in the ingredient list. Shopping Buy products labeled as "low-sodium" or "no salt added." Buy fresh foods. Avoid canned foods and pre-made or frozen meals. Cooking Avoid adding salt when cooking. Use salt-free seasonings or herbs instead of table salt or sea salt. Check with your health care provider or pharmacist before using salt substitutes. Do not fry foods. Cook foods using healthy methods such as baking, boiling, grilling, roasting, and broiling instead. Cook with heart-healthy oils, such as olive, canola, avocado, soybean, or sunflower oil. Meal planning  Eat a balanced diet that includes: 4 or more servings of fruits and 4 or more servings of vegetables each day. Try to fill one-half of your plate with fruits and vegetables. 6-8 servings of whole grains each day. Less than 6 oz (170 g) of lean meat, poultry, or fish each day. A 3-oz (85-g) serving of meat is about the same size as a deck of cards. One egg equals 1 oz (28 g). 2-3 servings of low-fat dairy each day. One serving is 1 cup (237 mL). 1 serving of nuts, seeds, or beans 5 times each week. 2-3 servings of heart-healthy fats. Healthy fats called omega-3 fatty acids are found in foods such as walnuts, flaxseeds, fortified milks, and eggs. These fats are also found in cold-water fish, such as sardines, salmon,  and mackerel. Limit how much you eat of: Canned or prepackaged foods. Food that is high in trans fat, such as some fried foods. Food that is high in saturated fat, such as fatty meat. Desserts and other sweets, sugary drinks, and other foods with added sugar. Full-fat dairy products. Do not salt foods before eating. Do not eat more than 4 egg yolks a week. Try to eat at least 2 vegetarian meals a week. Eat more home-cooked food and less restaurant, buffet, and fast food. Lifestyle When eating at a restaurant, ask that your food be prepared with less salt or no salt, if possible. If you drink alcohol: Limit how much you use to: 0-1 drink a day for women who are not pregnant. 0-2 drinks a day for men. Be aware of how much alcohol is in your drink. In the [place] one drink equals one 12 oz bottle of beer (355 mL), one 5 oz glass of wine (148 mL), or one 1 oz glass of hard liquor (44 mL). General information Avoid eating more than 2,300 mg of salt a day. If you have hypertension, you may need to reduce your sodium intake to 1,500 mg a day. Work with your health care provider to maintain a healthy body weight or to lose weight. Ask what an ideal weight is for you. Get at least 30 minutes of exercise that causes your heart to beat faster (aerobic exercise) most days of the week. Activities may include walking, swimming, or biking. Work with your health care provider or dietitian to   adjust your eating plan to your individual calorie needs. What foods should I eat? Fruits All fresh, dried, or frozen fruit. Canned fruit in natural juice (without added sugar). Vegetables Fresh or frozen vegetables (raw, steamed, roasted, or grilled). Low-sodium or reduced-sodium tomato and vegetable juice. Low-sodium or reduced-sodium tomato sauce and tomato paste. Low-sodium or reduced-sodium canned vegetables. Grains Whole-grain or whole-wheat bread. Whole-grain or whole-wheat pasta. Brown rice. Oatmeal. Quinoa.  Bulgur. Whole-grain and low-sodium cereals. Pita bread. Low-fat, low-sodium crackers. Whole-wheat flour tortillas. Meats and other proteins Skinless chicken or turkey. Ground chicken or turkey. Pork with fat trimmed off. Fish and seafood. Egg whites. Dried beans, peas, or lentils. Unsalted nuts, nut butters, and seeds. Unsalted canned beans. Lean cuts of beef with fat trimmed off. Low-sodium, lean precooked or cured meat, such as sausages or meat loaves. Dairy Low-fat (1%) or fat-free (skim) milk. Reduced-fat, low-fat, or fat-free cheeses. Nonfat, low-sodium ricotta or cottage cheese. Low-fat or nonfat yogurt. Low-fat, low-sodium cheese. Fats and oils Soft margarine without trans fats. Vegetable oil. Reduced-fat, low-fat, or light mayonnaise and salad dressings (reduced-sodium). Canola, safflower, olive, avocado, soybean, and sunflower oils. Avocado. Seasonings and condiments Herbs. Spices. Seasoning mixes without salt. Other foods Unsalted popcorn and pretzels. Fat-free sweets. The items listed above may not be a complete list of foods and beverages you can eat. Contact a dietitian for more information. What foods should I avoid? Fruits Canned fruit in a light or heavy syrup. Fried fruit. Fruit in cream or butter sauce. Vegetables Creamed or fried vegetables. Vegetables in a cheese sauce. Regular canned vegetables (not low-sodium or reduced-sodium). Regular canned tomato sauce and paste (not low-sodium or reduced-sodium). Regular tomato and vegetable juice (not low-sodium or reduced-sodium). Pickles. Olives. Grains Baked goods made with fat, such as croissants, muffins, or some breads. Dry pasta or rice meal packs. Meats and other proteins Fatty cuts of meat. Ribs. Fried meat. Bacon. Bologna, salami, and other precooked or cured meats, such as sausages or meat loaves. Fat from the back of a pig (fatback). Bratwurst. Salted nuts and seeds. Canned beans with added salt. Canned or smoked fish.  Whole eggs or egg yolks. Chicken or turkey with skin. Dairy Whole or 2% milk, cream, and half-and-half. Whole or full-fat cream cheese. Whole-fat or sweetened yogurt. Full-fat cheese. Nondairy creamers. Whipped toppings. Processed cheese and cheese spreads. Fats and oils Butter. Stick margarine. Lard. Shortening. Ghee. Bacon fat. Tropical oils, such as coconut, palm kernel, or palm oil. Seasonings and condiments Onion salt, garlic salt, seasoned salt, table salt, and sea salt. Worcestershire sauce. Tartar sauce. Barbecue sauce. Teriyaki sauce. Soy sauce, including reduced-sodium. Steak sauce. Canned and packaged gravies. Fish sauce. Oyster sauce. Cocktail sauce. Store-bought horseradish. Ketchup. Mustard. Meat flavorings and tenderizers. Bouillon cubes. Hot sauces. Pre-made or packaged marinades. Pre-made or packaged taco seasonings. Relishes. Regular salad dressings. Other foods Salted popcorn and pretzels. The items listed above may not be a complete list of foods and beverages you should avoid. Contact a dietitian for more information. Where to find more information National Heart, Lung, and Blood Institute: www.nhlbi.nih.gov American Heart Association: www.heart.org Academy of Nutrition and Dietetics: www.eatright.org National Kidney Foundation: www.kidney.org Summary The DASH eating plan is a healthy eating plan that has been shown to reduce high blood pressure (hypertension). It may also reduce your risk for type 2 diabetes, heart disease, and stroke. When on the DASH eating plan, aim to eat more fresh fruits and vegetables, whole grains, lean proteins, low-fat dairy, and heart-healthy fats. With the DASH   eating plan, you should limit salt (sodium) intake to 2,300 mg a day. If you have hypertension, you may need to reduce your sodium intake to 1,500 mg a day. Work with your health care provider or dietitian to adjust your eating plan to your individual calorie needs. This information is not  intended to replace advice given to you by your health care provider. Make sure you discuss any questions you have with your health care provider. Document Revised: 08/20/2019 Document Reviewed: 08/20/2019 Elsevier Patient Education  [place].   Hypertension, Adult Hypertension is another name for high blood pressure. High blood pressure forces your heart to work harder to pump blood. This can cause problems over time. There are two numbers in a blood pressure reading. There is a top number (systolic) over a bottom number (diastolic). It is best to have a blood pressure that is below 120/80. What are the causes? The cause of this condition is not known. Some other conditions can lead to high blood pressure. What increases the risk? Some lifestyle factors can make you more likely to develop high blood pressure: Smoking. Not getting enough exercise or physical activity. Being overweight. Having too much fat, sugar, calories, or salt (sodium) in your diet. Drinking too much alcohol. Other risk factors include: Having any of these conditions: Heart disease. Diabetes. High cholesterol. Kidney disease. Obstructive sleep apnea. Having a family history of high blood pressure and high cholesterol. Age. The risk increases with age. Stress. What are the signs or symptoms? High blood pressure may not cause symptoms. Very high blood pressure (hypertensive crisis) may cause: Headache. Fast or uneven heartbeats (palpitations). Shortness of breath. Nosebleed. Vomiting or feeling like you may vomit (nauseous). Changes in how you see. Very bad chest pain. Feeling dizzy. Seizures. How is this treated? This condition is treated by making healthy lifestyle changes, such as: Eating healthy foods. Exercising more. Drinking less alcohol. Your doctor may prescribe medicine if lifestyle changes do not help enough and if: Your top number is above 130. Your bottom number is above 80. Your  personal target blood pressure may vary. Follow these instructions at home: Eating and drinking  If told, follow the DASH eating plan. To follow this plan: Fill one half of your plate at each meal with fruits and vegetables. Fill one fourth of your plate at each meal with whole grains. Whole grains include whole-wheat pasta, brown rice, and whole-grain bread. Eat or drink low-fat dairy products, such as skim milk or low-fat yogurt. Fill one fourth of your plate at each meal with low-fat (lean) proteins. Low-fat proteins include fish, chicken without skin, eggs, beans, and tofu. Avoid fatty meat, cured and processed meat, or chicken with skin. Avoid pre-made or processed food. Limit the amount of salt in your diet to less than 1,500 mg each day. Do not drink alcohol if: Your doctor tells you not to drink. You are pregnant, may be pregnant, or are planning to become pregnant. If you drink alcohol: Limit how much you have to: 0-1 drink a day for women. 0-2 drinks a day for men. Know how much alcohol is in your drink. In the [place] one drink equals one 12 oz bottle of beer (355 mL), one 5 oz glass of wine (148 mL), or one 1 oz glass of hard liquor (44 mL). Lifestyle  Work with your doctor to stay at a healthy weight or to lose weight. Ask your doctor what the best weight is for you. Get at least  30 minutes of exercise that causes your heart to beat faster (aerobic exercise) most days of the week. This may include walking, swimming, or biking. Get at least 30 minutes of exercise that strengthens your muscles (resistance exercise) at least 3 days a week. This may include lifting weights or doing Pilates. Do not smoke or use any products that contain nicotine or tobacco. If you need help quitting, ask your doctor. Check your blood pressure at home as told by your doctor. Keep all follow-up visits. Medicines Take over-the-counter and prescription medicines only as told by your doctor. Follow  directions carefully. Do not skip doses of blood pressure medicine. The medicine does not work as well if you skip doses. Skipping doses also puts you at risk for problems. Ask your doctor about side effects or reactions to medicines that you should watch for. Contact a doctor if: You think you are having a reaction to the medicine you are taking. You have headaches that keep coming back. You feel dizzy. You have swelling in your ankles. You have trouble with your vision. Get help right away if: You get a very bad headache. You start to feel mixed up (confused). You feel weak or numb. You feel faint. You have very bad pain in your: Chest. Belly (abdomen). You vomit more than once. You have trouble breathing. These symptoms may be an emergency. Get help right away. Call 911. Do not wait to see if the symptoms will go away. Do not drive yourself to the hospital. Summary Hypertension is another name for high blood pressure. High blood pressure forces your heart to work harder to pump blood. For most people, a normal blood pressure is less than 120/80. Making healthy choices can help lower blood pressure. If your blood pressure does not get lower with healthy choices, you may need to take medicine. This information is not intended to replace advice given to you by your health care provider. Make sure you discuss any questions you have with your health care provider. Document Revised: 07/05/2021 Document Reviewed: 07/05/2021 Elsevier Patient Education  [place].

## 2022-08-12 NOTE — Progress Notes (Signed)
Barnet Glasgow Martin,acting as a Education administrator for Maximino Greenland, MD.,have documented all relevant documentation on the behalf of Maximino Greenland, MD,as directed by  Maximino Greenland, MD while in the presence of Maximino Greenland, MD.    Subjective:     Patient ID: Alyssa Oliver , female    DOB: [birth year] , 46 y.o.   MRN: [id number]   Chief Complaint  Patient presents with   Establish Care   Hypertension    HPI  Patient presents today to establish care, previous PCP: Everardo Beals. 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  She is followed by Dr. Diamond Nickel for her GYN care. She has her mammograms performed at the GYN office.  She is married, next year she would have been married 20 years. She has a 27 year old son and a 64 year old daughter.   She reports a history of high blood pressure and hypothyroidism.  She also reports a history of anemia, she has h/o heavy cycles. She has since had a hysterectomy.  She reports compliance with meds. She c/o right shoulder pain, she states sometimes it even feels like it is numb. She denies fall/trauma. 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   BP Readings from Last 3 Encounters: 08/12/22 : (!) 140/70 06/14/22 : (!) 140/62 01/07/22 : (!) 152/82    Hypertension This is a chronic problem. The current episode started more than 1 year ago. The problem has been gradually improving since onset. Pertinent negatives include no blurred vision, chest pain, palpitations or shortness of breath. Risk factors for coronary artery disease include obesity. The current treatment provides moderate improvement. There is no history of kidney disease. There is no history of hyperaldosteronism or hypercortisolism.     Past Medical History:  Diagnosis Date   Acute appendicitis 03/17/2019   Adnexal mass    Diabetes mellitus without complication (HCC)    Fibroid uterus    Headache    Hypertension    Thyroid disease      Family History   Problem Relation Age of Onset   Colon cancer Neg Hx    Breast cancer Neg Hx    Ovarian cancer Neg Hx    Endometrial cancer Neg Hx    Pancreatic cancer Neg Hx    Prostate cancer Neg Hx      Current Outpatient Medications:    FERROCITE 324 MG TABS tablet, Take 1 tablet by mouth daily., Disp: , Rfl:    Ferrous Fumarate (FERROCITE PO), Take 1 tablet by mouth daily., Disp: , Rfl:    hydrochlorothiazide (HYDRODIURIL) 12.5 MG tablet, Take 25 mg by mouth daily., Disp: , Rfl:    hydrochlorothiazide (HYDRODIURIL) 25 MG tablet, Take 25 mg by mouth daily., Disp: , Rfl:    [START ON 10/09/2022] Semaglutide-Weight Management 1 MG/0.5ML SOAJ, Inject 1 mg into the skin once a week for 28 days. (10-09-22), Disp: 2 mL, Rfl: 0   levothyroxine (SYNTHROID) 75 MCG tablet, Take 1 tablet (75 mcg total) by mouth daily., Disp: 30 tablet, Rfl: 2   potassium chloride SA (KLOR-CON M) 20 MEQ tablet, Take 1 tablet (20 mEq total) by mouth 2 (two) times daily for 2 doses. (Patient not taking: Reported on 08/12/2022), Disp: 2 tablet, Rfl: 0   Allergies  Allergen Reactions   Penicillins Itching   Sulfa Antibiotics Itching     Review of Systems  Constitutional: Negative.   Eyes:  Negative for blurred vision.  Respiratory: Negative.  Negative for shortness of breath.   Cardiovascular: Negative.  Negative for chest pain and palpitations.  Gastrointestinal: Negative.   Musculoskeletal:  Positive for arthralgias.       She c/o r shoulder/neck pain. This is a chronic issue. 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   Neurological: Negative.   Psychiatric/Behavioral: Negative.       Today's Vitals   08/12/22 1108 08/12/22 1205  BP: (!) 140/70 136/68  Pulse: 86   Temp: 98.6 F (37 C)   TempSrc: Oral   Weight: 259 lb 12.8 oz (117.8 kg)   Height: '5\' 3"'$  (1.6 m)   PainSc: 8    PainLoc: Shoulder    Body mass index is 46.02 kg/m.  Wt Readings from Last 3 Encounters:   08/12/22 259 lb 12.8 oz (117.8 kg)  01/07/22 253 lb (114.8 kg)  09/28/21 246 lb 3.2 oz (111.7 kg)    Objective:  Physical Exam Vitals and nursing note reviewed.  Constitutional:      Appearance: Normal appearance. She is obese.  HENT:     Head: Normocephalic and atraumatic.     Nose:     Comments: Masked     Mouth/Throat:     Comments: masked Eyes:     Extraocular Movements: Extraocular movements intact.  Cardiovascular:     Rate and Rhythm: Normal rate and regular rhythm.     Heart sounds: Normal heart sounds.  Pulmonary:     Effort: Pulmonary effort is normal.     Breath sounds: Normal breath sounds.  Musculoskeletal:        General: Tenderness present. No swelling or signs of injury.     Cervical back: Normal range of motion. Tenderness present.  Skin:    General: Skin is warm.  Neurological:     General: No focal deficit present.     Mental Status: She is alert.  Psychiatric:        Mood and Affect: Mood normal.        Behavior: Behavior normal.      Assessment And Plan:     1. Essential hypertension, benign Comments: Chronic, uncontrolled. Previous EKG reviewed. I will not change meds, she agrees to rto in 2 weeks for nurse visit. Plan to add amlodipine if needed. - Microalbumin / Creatinine Urine Ratio - POCT Urinalysis Dipstick (81002)  2. Primary hypothyroidism Comments: Previous labs reviewed in Care Everywhere. She wishes to switch to Brand Synthroid 76mg daily. She was given information on [employer] - TSH + free T4  3. Neck pain on right side Comments: She was given info for [facility] for Chiro/laser therapy. She was also advised to apply topical pain rub to affected area nightly and to do stretches.  4. Prediabetes Comments: I will check an a1c at her next visit. She is encouraged to limit her intake of sugary foods/beverages.  5. BMI 45.0-49.9, adult (HCC) Comments: BMI 46. She is encouraged to aim for at least 150 minutes of  exercise per week, while initially striving for BMI<40 to decrease cradaic risk. We discussed the use of Wegovy for obesity. She denies family/personal h/o thyroid cancer. I will send rx to start prior authorization process. If approved, she will rto for nurse visit to learn how to self administer the medication. She is reminded to stop eating when full. She will rto in 4 weeks for re-evaluation.  Possible side effects d/w patient.  6. Encounter to establish care   Patient was given opportunity to ask questions. Patient verbalized understanding of the plan and was able to repeat key elements of the plan. All questions were answered to their satisfaction.   I, Maximino Greenland, MD, have reviewed all documentation for this visit. The documentation on 08/12/22 for the exam, diagnosis, procedures, and orders are all accurate and complete.   IF YOU HAVE BEEN REFERRED TO A SPECIALIST, IT MAY TAKE 1-2 WEEKS TO SCHEDULE/PROCESS THE REFERRAL. IF YOU HAVE NOT HEARD FROM US/SPECIALIST IN TWO WEEKS, PLEASE GIVE [place] A CALL AT [phone] X [phone].   THE PATIENT IS ENCOURAGED TO PRACTICE SOCIAL DISTANCING DUE TO THE COVID-19 PANDEMIC.

## 2022-08-13 ENCOUNTER — Other Ambulatory Visit: Payer: Self-pay

## 2022-08-13 LAB — TSH+FREE T4
Free T4: 1.25 ng/dL (ref 0.82–1.77)
TSH: 3.68 u[IU]/mL (ref 0.450–4.500)

## 2022-08-13 LAB — MICROALBUMIN / CREATININE URINE RATIO
Creatinine, Urine: 159.6 mg/dL
Microalb/Creat Ratio: 5 mg/g creat (ref 0–29)
Microalbumin, Urine: 7.2 ug/mL

## 2022-08-13 MED ORDER — LEVOTHYROXINE SODIUM 75 MCG PO TABS: 75.0000 ug | ORAL_TABLET | Freq: Every day | ORAL | 2 refills | Status: DC

## 2022-08-15 ENCOUNTER — Telehealth: Payer: Self-pay

## 2022-08-15 NOTE — Telephone Encounter (Signed)
Patient notified that her prior auth for [employer] [employer] has been approved through 03/12/23. yL,RMA

## 2022-08-18 DIAGNOSIS — M542 Cervicalgia: Secondary | ICD-10-CM | POA: Insufficient documentation

## 2022-08-18 DIAGNOSIS — I10 Essential (primary) hypertension: Secondary | ICD-10-CM | POA: Insufficient documentation

## 2022-08-18 DIAGNOSIS — R7303 Prediabetes: Secondary | ICD-10-CM | POA: Insufficient documentation

## 2022-08-18 DIAGNOSIS — E039 Hypothyroidism, unspecified: Secondary | ICD-10-CM | POA: Insufficient documentation

## 2022-08-20 ENCOUNTER — Ambulatory Visit: Payer: No Typology Code available for payment source

## 2022-08-28 ENCOUNTER — Encounter (INDEPENDENT_AMBULATORY_CARE_PROVIDER_SITE_OTHER): Payer: Self-pay

## 2022-08-31 ENCOUNTER — Other Ambulatory Visit: Payer: Self-pay | Admitting: Internal Medicine

## 2022-09-02 ENCOUNTER — Ambulatory Visit
Admission: RE | Admit: 2022-09-02 | Discharge: 2022-09-02 | Disposition: A | Discharge status: Home / Self Care | Payer: No Typology Code available for payment source | Source: Ambulatory Visit | Attending: Chiropractic Medicine | Admitting: Chiropractic Medicine

## 2022-09-02 ENCOUNTER — Other Ambulatory Visit: Payer: Self-pay

## 2022-09-02 ENCOUNTER — Other Ambulatory Visit: Payer: Self-pay | Admitting: Chiropractic Medicine

## 2022-09-02 DIAGNOSIS — M79601 Pain in right arm: Secondary | ICD-10-CM

## 2022-09-02 DIAGNOSIS — M542 Cervicalgia: Secondary | ICD-10-CM

## 2022-09-02 MED ORDER — SYNTHROID 75 MCG PO TABS: 75.0000 ug | ORAL_TABLET | Freq: Every day | ORAL | 2 refills | Status: DC

## 2022-09-06 ENCOUNTER — Ambulatory Visit (HOSPITAL_COMMUNITY)
Admission: EM | Admit: 2022-09-06 | Discharge: 2022-09-06 | Disposition: A | Discharge status: Home / Self Care | Payer: No Typology Code available for payment source | Attending: Internal Medicine | Admitting: Internal Medicine

## 2022-09-06 ENCOUNTER — Encounter (HOSPITAL_COMMUNITY): Payer: Self-pay | Admitting: Emergency Medicine

## 2022-09-06 DIAGNOSIS — M7918 Myalgia, other site: Secondary | ICD-10-CM

## 2022-09-06 DIAGNOSIS — M62838 Other muscle spasm: Secondary | ICD-10-CM

## 2022-09-06 DIAGNOSIS — M542 Cervicalgia: Secondary | ICD-10-CM

## 2022-09-06 MED ORDER — KETOROLAC TROMETHAMINE 30 MG/ML IJ SOLN
30.0000 mg | Freq: Once | INTRAMUSCULAR | Status: AC
  Administered 2022-09-06: 30 mg via INTRAMUSCULAR

## 2022-09-06 MED ORDER — BACLOFEN 10 MG PO TABS: 10.0000 mg | ORAL_TABLET | Freq: Three times a day (TID) | ORAL | 0 refills | Status: DC

## 2022-09-06 MED ORDER — KETOROLAC TROMETHAMINE 30 MG/ML IJ SOLN
INTRAMUSCULAR | Status: AC
  Filled 2022-09-06: qty 1

## 2022-09-06 MED ORDER — IBUPROFEN 600 MG PO TABS: 600.0000 mg | ORAL_TABLET | Freq: Four times a day (QID) | ORAL | 0 refills | Status: DC | PRN

## 2022-09-06 NOTE — ED Provider Notes (Signed)
[place]    CSN: [id number] Arrival date & time: 09/06/22  1517      History   Chief Complaint Chief Complaint  Patient presents with  . Arm Pain    HPI Alyssa Oliver is a 46 y.o. female.    Arm Pain   Past Medical History:  Diagnosis Date  . Acute appendicitis 03/17/2019  . Adnexal mass   . Diabetes mellitus without complication ([place])   . Fibroid uterus   . Headache   . Hypertension   . Thyroid disease     Patient Active Problem List   Diagnosis Date Noted  . Essential hypertension, benign 08/18/2022  . Primary hypothyroidism 08/18/2022  . Neck pain on right side 08/18/2022  . Prediabetes 08/18/2022  . Acute post-operative pain 09/14/2021  . Uterine leiomyoma   . Edema of left lower extremity 09/04/2021  . Abnormal uterine bleeding (AUB) 07/30/2021  . Adnexal mass 07/30/2021  . BMI 45.0-49.9, adult ([place]) 07/30/2021  . Trochanteric bursitis of left hip 09/14/2018  . Pain of left hip joint 09/14/2018    Past Surgical History:  Procedure Laterality Date  . ABDOMINAL HYSTERECTOMY    . APPENDECTOMY  03/17/2019  . CESAREAN SECTION    . LAPAROSCOPIC APPENDECTOMY N/A 03/17/2019   Procedure: APPENDECTOMY LAPAROSCOPIC;  Surgeon: Coralie Keens, MD;  Location: [facility];  Service: General;  Laterality: N/A;  . ROBOTIC ASSISTED LAPAROSCOPIC HYSTERECTOMY AND SALPINGECTOMY N/A 09/06/2021   Procedure: XI ROBOTIC ASSISTED LAPAROSCOPIC HYSTERECTOMY GREATER THAN TWO HUNDRED AND FIFTY GRAMS, BILATERAL SALPINGECTOMY,MINI LAPAROTOMY FOR SPECIMEN REMOVAL, CYSTOSCOPY;  Surgeon: Lafonda Mosses, MD;  Location: [facility];  Service: Gynecology;  Laterality: N/A;    OB History     Gravida  3   Para  2   Term      Preterm      AB  1   Living         SAB      IAB      Ectopic      Multiple      Live Births               Home Medications    Prior to Admission medications   Medication Sig Start Date End Date Taking? Authorizing Provider   FERROCITE 324 MG TABS tablet Take 1 tablet by mouth daily. 04/15/22   [provider]  Ferrous Fumarate (FERROCITE PO) Take 1 tablet by mouth daily.    [provider]  hydrochlorothiazide (HYDRODIURIL) 12.5 MG tablet Take 25 mg by mouth daily. 06/13/21   [provider]  hydrochlorothiazide (HYDRODIURIL) 25 MG tablet Take 25 mg by mouth daily. 06/21/22   [provider]  potassium chloride SA (KLOR-CON M) 20 MEQ tablet Take 1 tablet (20 mEq total) by mouth 2 (two) times daily for 2 doses. Patient not taking: Reported on 08/12/2022 09/03/21 09/06/21  [name] D, NP  Semaglutide-Weight Management 1 MG/0.5ML SOAJ Inject 1 mg into the skin once a week for 28 days. (10-09-22) 10/09/22 11/06/22  Glendale Chard, MD  SYNTHROID 75 MCG tablet Take 1 tablet (75 mcg total) by mouth daily. 09/02/22 09/02/23  Glendale Chard, MD    Family History Family History  Problem Relation Age of Onset  . Colon cancer Neg Hx   . Breast cancer Neg Hx   . Ovarian cancer Neg Hx   . Endometrial cancer Neg Hx   . Pancreatic cancer Neg Hx   . Prostate cancer Neg Hx  Social History Social History   Tobacco Use  . Smoking status: Never  . Smokeless tobacco: Never  Vaping Use  . Vaping Use: Never used  Substance Use Topics  . Alcohol use: Not Currently  . Drug use: Never     Allergies   Penicillins and Sulfa antibiotics   Review of Systems Review of Systems   Physical Exam Triage Vital Signs ED Triage Vitals [09/06/22 1622]  Enc Vitals Group     BP (!) 154/56     Pulse Rate 78     Resp 17     Temp (!) 97.5 F (36.4 C)     Temp Source Oral     SpO2 100 %     Weight      Height      Head Circumference      Peak Flow      Pain Score      Pain Loc      Pain Edu?      Excl. in [place]?    No data found.  Updated Vital Signs BP (!) 154/56 (BP Location: Left Arm)   Pulse 78   Temp (!) 97.5 F (36.4 C) (Oral)   Resp 17   LMP 08/24/2021 (Exact Date)   SpO2  100%   Visual Acuity Right Eye Distance:   Left Eye Distance:   Bilateral Distance:    Right Eye Near:   Left Eye Near:    Bilateral Near:     Physical Exam   UC Treatments / Results  Labs (all labs ordered are listed, but only abnormal results are displayed) Labs Reviewed - No data to display  EKG   Radiology No results found.  Procedures Procedures (including critical care time)  Medications Ordered in UC Medications  ketorolac (TORADOL) 30 MG/ML injection 30 mg (has no administration in time range)    Initial Impression / Assessment and Plan / UC Course  I have reviewed the triage vital signs and the nursing notes.  Pertinent labs & imaging results that were available during my care of the patient were reviewed by me and considered in my medical decision making (see chart for details).     *** Final Clinical Impressions(s) / UC Diagnoses   Final diagnoses:  None   Discharge Instructions   None    ED Prescriptions   None    PDMP not reviewed this encounter.

## 2022-09-06 NOTE — ED Triage Notes (Signed)
Pt had issues with neck pains that radiates to right arm. Pt reports pain worse. Had scan done on Monday but results were not sent to pt's chiropractor so patient wanting results.

## 2022-09-06 NOTE — Discharge Instructions (Addendum)
You have been evaluated in the today for your neck/right arm pain. Your pain is most likely muscle strain which will improve on its own with time.   You may take ibuprofen 600 mg every 6 hours as needed for pain and inflammation.  Your first dose of ibuprofen when you get home may be tomorrow afternoon since you were given injection of ketorolac '30mg'$  IM in the clinic today.  You may also take baclofen muscle relaxer every 8 hours as needed for muscle spasm.  Do not take this medication and drive or drink alcohol as it can make you sleepy.  Mainly use this medicine at nighttime as needed.  Apply heat and perform gentle range of motion exercises to the area of greatest pain to prevent muscle stiffness and provide further pain relief.   Follow-up with chiropractor.  Follow-up with your primary care provider or return to urgent care if your symptoms do not improve in the next 3 to 4 days with medications and interventions recommended today.  If you develop any new or worsening symptoms, please return to urgent care.  If your symptoms are severe, please go to the emergency room.  I hope you feel better!

## 2022-10-01 ENCOUNTER — Ambulatory Visit: Payer: No Typology Code available for payment source | Admitting: Internal Medicine

## 2022-10-24 ENCOUNTER — Other Ambulatory Visit: Payer: Self-pay | Admitting: Internal Medicine

## 2022-12-03 ENCOUNTER — Other Ambulatory Visit: Payer: Self-pay | Admitting: Internal Medicine

## 2022-12-04 ENCOUNTER — Other Ambulatory Visit: Payer: Self-pay

## 2022-12-15 ENCOUNTER — Other Ambulatory Visit: Payer: Self-pay | Admitting: Internal Medicine

## 2022-12-17 ENCOUNTER — Other Ambulatory Visit: Payer: Self-pay | Admitting: Nurse Practitioner

## 2022-12-17 ENCOUNTER — Encounter: Payer: Self-pay | Admitting: Nurse Practitioner

## 2022-12-17 ENCOUNTER — Ambulatory Visit (INDEPENDENT_AMBULATORY_CARE_PROVIDER_SITE_OTHER): Payer: No Typology Code available for payment source | Admitting: Nurse Practitioner

## 2022-12-17 VITALS — BP 132/78 | HR 74 | Temp 98.1°F | Ht 63.0 in | Wt 256.0 lb

## 2022-12-17 DIAGNOSIS — R202 Paresthesia of skin: Secondary | ICD-10-CM

## 2022-12-17 DIAGNOSIS — I10 Essential (primary) hypertension: Secondary | ICD-10-CM | POA: Diagnosis not present

## 2022-12-17 DIAGNOSIS — E039 Hypothyroidism, unspecified: Secondary | ICD-10-CM

## 2022-12-17 DIAGNOSIS — R7303 Prediabetes: Secondary | ICD-10-CM | POA: Diagnosis not present

## 2022-12-17 DIAGNOSIS — E559 Vitamin D deficiency, unspecified: Secondary | ICD-10-CM

## 2022-12-17 MED ORDER — BACLOFEN 10 MG PO TABS: 10.0000 mg | ORAL_TABLET | Freq: Three times a day (TID) | ORAL | 0 refills | Status: DC

## 2022-12-17 MED ORDER — IBUPROFEN 600 MG PO TABS: 600.0000 mg | ORAL_TABLET | Freq: Four times a day (QID) | ORAL | 0 refills | Status: DC | PRN

## 2022-12-17 NOTE — Patient Instructions (Signed)
Get wrist splint with thumb to wear at night to help with tingling Also get an elbow splint for when working to help with tingling.  Take tylenol 500 -1000 mg twice a day for 3-5 days to see if helps with any inflammation and tingling.

## 2022-12-17 NOTE — Progress Notes (Signed)
I,Sheena H Holbrook,acting as a Education administrator for Minette Brine, FNP.,have documented all relevant documentation on the behalf of Minette Brine, FNP,as directed by  Minette Brine, FNP while in the presence of Minette Brine, [place].    Subjective:     Patient ID: Alyssa Oliver , female    DOB: 06-29-1976 , 47 y.o.   MRN: [id number]   Chief Complaint  Patient presents with   Medical Management of Chronic Issues   Weight Check    HPI  Patient presents today for weight check and hypertension follow up. Since increasing her HCTZ to 25 mg she has been feeling better. She thinks she may have not taken her medications this morning.   Patient also has continued burning pain in her left arm around the elbow, she also has occasional difficulty with lifting things with her hand. She also reports occasional tingling sensation in her fingers, denies any numbness. She has been seeing the chiropractor and this has provided some relief. Patient is asking for refills on Baclofen, Vit D (weekly) and Ibuprofen 600mg .   She is right handed.   Wt Readings from Last 3 Encounters: 12/17/22 : 256 lb (116.1 kg) 08/12/22 : 259 lb 12.8 oz (117.8 kg) 01/07/22 : 253 lb (114.8 kg)  She is not taking the [facility], sometimes she just does not want to feel good and did not want to add medications. She has changed her diet. She is planning to add exercise and change her schedule. She is caring for her daughter who she has to pick up from practice. She started changing diet 2 weeks ago.       Past Medical History:  Diagnosis Date   Acute appendicitis 03/17/2019   Adnexal mass    Diabetes mellitus without complication (HCC)    Fibroid uterus    Headache    Hypertension    Thyroid disease      Family History  Problem Relation Age of Onset   Colon cancer Neg Hx    Breast cancer Neg Hx    Ovarian cancer Neg Hx    Endometrial cancer Neg Hx    Pancreatic cancer Neg Hx    Prostate cancer Neg Hx      Current Outpatient  Medications:    FERROCITE 324 MG TABS tablet, Take 1 tablet by mouth daily., Disp: , Rfl:    hydrochlorothiazide (HYDRODIURIL) 25 MG tablet, TAKE 1 TABLET BY MOUTH EVERY DAY, Disp: 90 tablet, Rfl: 1   SYNTHROID 75 MCG tablet, Take 1 tablet (75 mcg total) by mouth daily., Disp: 30 tablet, Rfl: 2   baclofen (LIORESAL) 10 MG tablet, Take 1 tablet (10 mg total) by mouth 3 (three) times daily., Disp: 30 each, Rfl: 0   ibuprofen (ADVIL) 600 MG tablet, Take 1 tablet (600 mg total) by mouth every 6 (six) hours as needed., Disp: 30 tablet, Rfl: 0   potassium chloride SA (KLOR-CON M) 20 MEQ tablet, Take 1 tablet (20 mEq total) by mouth 2 (two) times daily for 2 doses. (Patient not taking: Reported on 08/12/2022), Disp: 2 tablet, Rfl: 0   Allergies  Allergen Reactions   Penicillins Itching   Sulfa Antibiotics Itching     Review of Systems  Constitutional: Negative.   Respiratory: Negative.    Cardiovascular: Negative.   Musculoskeletal:  Positive for arthralgias.  Psychiatric/Behavioral: Negative.    All other systems reviewed and are negative.    Today's Vitals   12/17/22 1501 12/17/22 1604  BP: (!) 142/88 132/78  Pulse: 74  Temp: 98.1 F (36.7 C)   TempSrc: Oral   SpO2: 97%   Weight: 256 lb (116.1 kg)   Height: 5\' 3"  (1.6 m)    Body mass index is 45.35 kg/m.   Objective:  Physical Exam Vitals reviewed.  Constitutional:      Appearance: She is well-developed.  HENT:     Head: Normocephalic and atraumatic.  Eyes:     Pupils: Pupils are equal, round, and reactive to light.  Cardiovascular:     Rate and Rhythm: Normal rate and regular rhythm.     Pulses: Normal pulses.     Heart sounds: Normal heart sounds. No murmur heard. Pulmonary:     Effort: Pulmonary effort is normal. No respiratory distress.     Breath sounds: Normal breath sounds. No wheezing.  Musculoskeletal:        General: Normal range of motion.     Comments: Positive tingling with phalens test  Skin:     General: Skin is warm and dry.     Capillary Refill: Capillary refill takes less than 2 seconds.  Neurological:     General: No focal deficit present.     Mental Status: She is alert and oriented to person, place, and time.     Cranial Nerves: No cranial nerve deficit.  Psychiatric:        Mood and Affect: Mood normal.        Behavior: Behavior normal.        Thought Content: Thought content normal.        Judgment: Judgment normal.         Assessment And Plan:     1. Essential hypertension, benign Comments: Blood pressure is controlled, encouraged to focus on focusing on modifying lifestyle.  Continue current medications - BMP8+eGFR  2. Prediabetes Comments: Hemoglobin A1c is stable.  Diet controlled. - Hemoglobin A1c  3. Vitamin D deficiency Will check vitamin D level and supplement as needed.    Also encouraged to spend 15 minutes in the sun daily.  - VITAMIN D 25 Hydroxy (Vit-D Deficiency, Fractures)  4. Primary hypothyroidism Comments: Will check thyroid levels.  Continue Synthroid pending lab results. - TSH + free T4  5. Tingling sensation Comments: Left lateral arm tingling intermittent, she is to get a wrist splint and elbow splint. take tylenol twice a day for 3-5 days     Patient was given opportunity to ask questions. Patient verbalized understanding of the plan and was able to repeat key elements of the plan. All questions were answered to their satisfaction.  Minette Brine, FNP   I, Minette Brine, FNP, have reviewed all documentation for this visit. The documentation on 12/17/22 for the exam, diagnosis, procedures, and orders are all accurate and complete.   IF YOU HAVE BEEN REFERRED TO A SPECIALIST, IT MAY TAKE 1-2 WEEKS TO SCHEDULE/PROCESS THE REFERRAL. IF YOU HAVE NOT HEARD FROM US/SPECIALIST IN TWO WEEKS, PLEASE GIVE [place] A CALL AT [phone].   THE PATIENT IS ENCOURAGED TO PRACTICE SOCIAL DISTANCING DUE TO THE COVID-19 PANDEMIC.

## 2022-12-18 LAB — TSH+FREE T4
Free T4: 1.39 ng/dL (ref 0.82–1.77)
TSH: 3.47 u[IU]/mL (ref 0.450–4.500)

## 2022-12-18 LAB — HEMOGLOBIN A1C
Est. average glucose Bld gHb Est-mCnc: 134 mg/dL
Hgb A1c MFr Bld: 6.3 % — ABNORMAL HIGH (ref 4.8–5.6)

## 2022-12-18 LAB — BMP8+EGFR
BUN/Creatinine Ratio: 23 (ref 9–23)
BUN: 18 mg/dL (ref 6–24)
CO2: 24 mmol/L (ref 20–29)
Calcium: 10.4 mg/dL — ABNORMAL HIGH (ref 8.7–10.2)
Chloride: 101 mmol/L (ref 96–106)
Creatinine, Ser: 0.77 mg/dL (ref 0.57–1.00)
Glucose: 114 mg/dL — ABNORMAL HIGH (ref 70–99)
Potassium: 3.8 mmol/L (ref 3.5–5.2)
Sodium: 143 mmol/L (ref 134–144)
eGFR: 97 mL/min/{1.73_m2} (ref >59)

## 2022-12-18 LAB — VITAMIN D 25 HYDROXY (VIT D DEFICIENCY, FRACTURES): Vit D, 25-Hydroxy: 45.3 ng/mL (ref 30.0–100.0)

## 2023-01-03 IMAGING — MR MR PELVIS WO/W CM
12 of 19 series · 28 of 48 positions shown · IV contrast (20 ML Multihance)
Comparison: CT of the abdomen and pelvis from March 17, 2019.

CLINICAL DATA: A 44-year-old female presents with menorrhagia.

EXAM:
MRI PELVIS WITHOUT AND WITH CONTRAST
TECHNIQUE: Multiplanar multisequence MR imaging of the pelvis was performed
both before and after administration of intravenous contrast.
CONTRAST:  20mL MULTIHANCE GADOBENATE DIMEGLUMINE 529 MG/ML IV SOLN

[Series 3: T2 · coronal · 5.0mm · 0.78mm/px · 1 of 36 slices shown (1 of 5)]
[im 1/36]
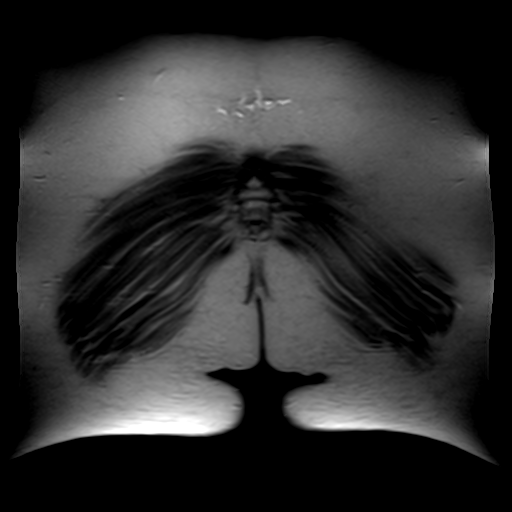

[Series 4: T2 · axial · 5.0mm · 0.98mm/px · 1 of 33 slices shown (2 of 5)]
[im 1/33]
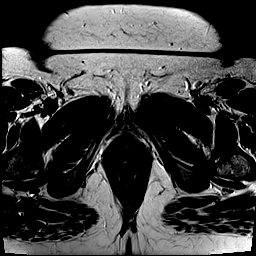

[Series 5: T2 fat-sat · axial · 5.0mm · 0.98mm/px · z∈[-1,+191]mm · 2 of 33 slices shown]
[im 1/33]
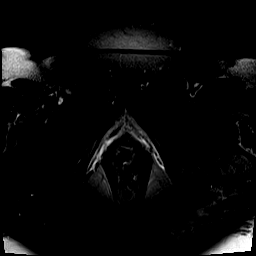
[im 33/33]
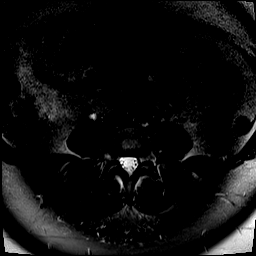

[Series 6: T2 · sagittal · 5.0mm · 0.98mm/px · 1 of 26 slices shown (3 of 5)]
[im 1/26]
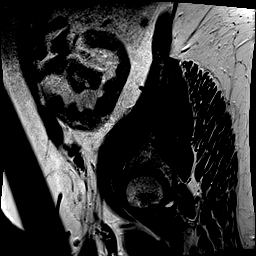

[Series 7: T2 · oblique · 3.0mm · 0.81mm/px · 2 of 34 slices shown (4 of 5)]
[im 1/34]
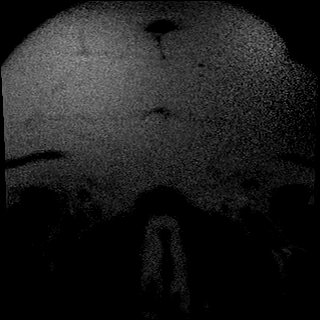
[im 34/34]
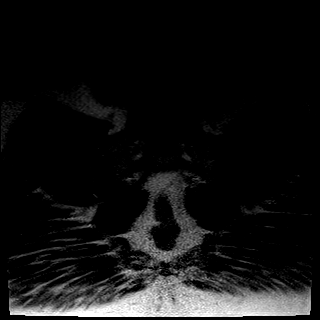

[Series 8: DWI · axial · 5.0mm · 1.72mm/px · z∈[+14,+188]mm · 5 of 90 slices shown]
[im 1/90]
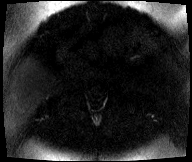
[im 23/90]
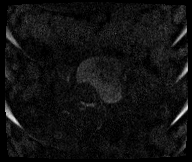
[im 45/90]
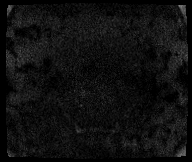
[im 67/90]
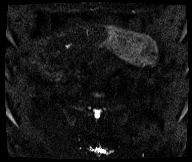
[im 90/90]
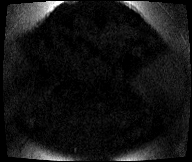

[Series 9: axial dwi_adc · axial · 5.0mm · 1.72mm/px · z∈[+14,+188]mm · 2 of 29 slices shown]
[im 1/29]
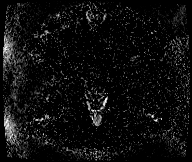
[im 29/29]
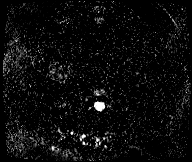

[Series 10: T2 · oblique · 3.0mm · 0.81mm/px · 2 of 34 slices shown (5 of 5)]
[im 1/34]
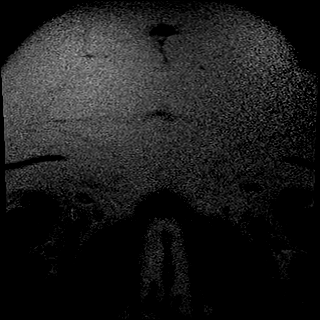
[im 34/34]
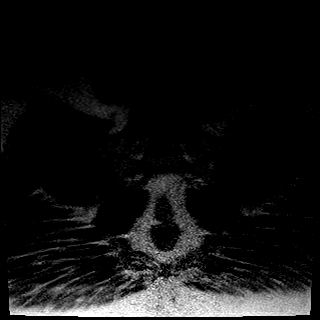

[Series 11: axial in out · axial · 5.5mm · 0.74mm/px · z∈[-2,+188]mm · 4 of 62 slices shown]
[im 1/62]
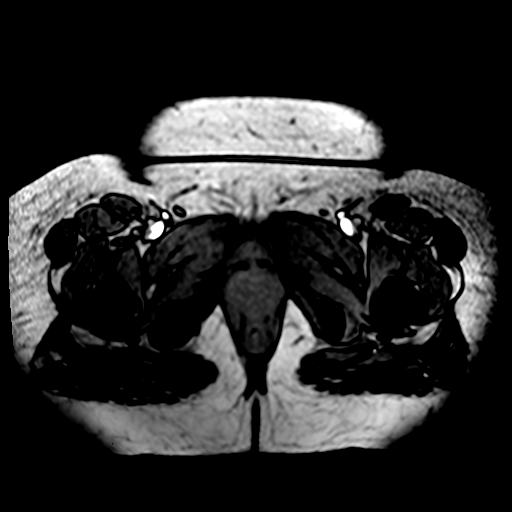
[im 21/62]
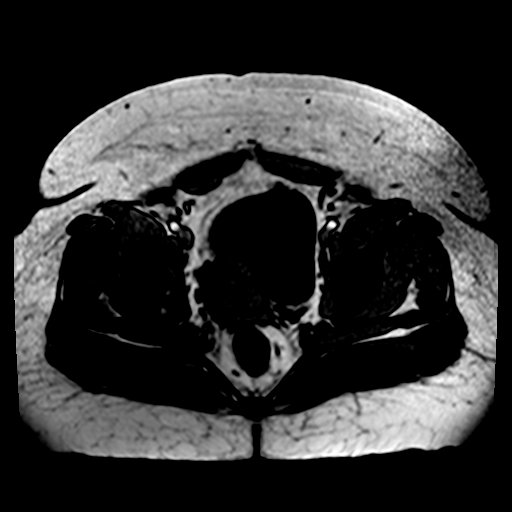
[im 41/62]
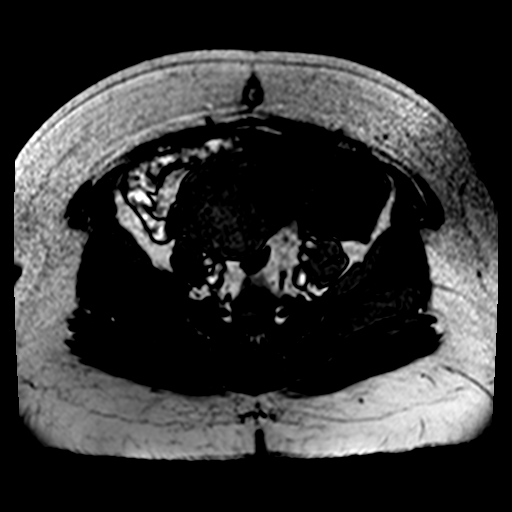
[im 62/62]
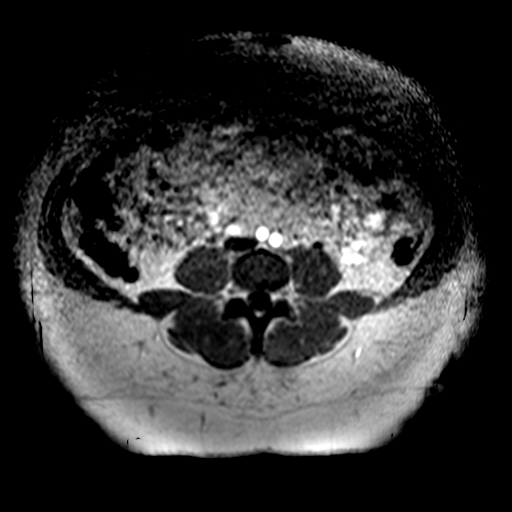

[Series 13: T1 dynamic · axial · non-contrast · 4.0mm · 0.51mm/px · z∈[+1,+188]mm · 3 of 48 slices shown (1 of 2)]
[im 1/48]
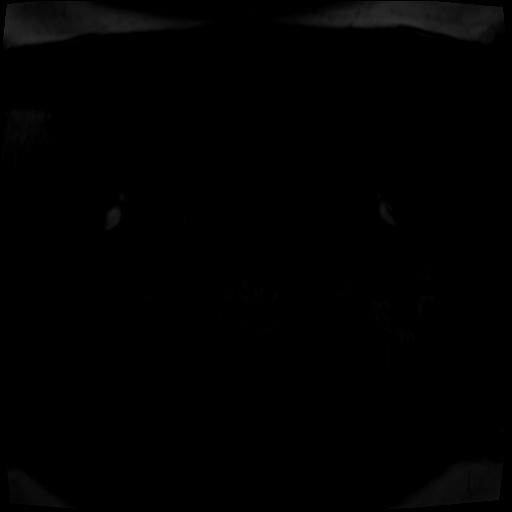
[im 24/48]
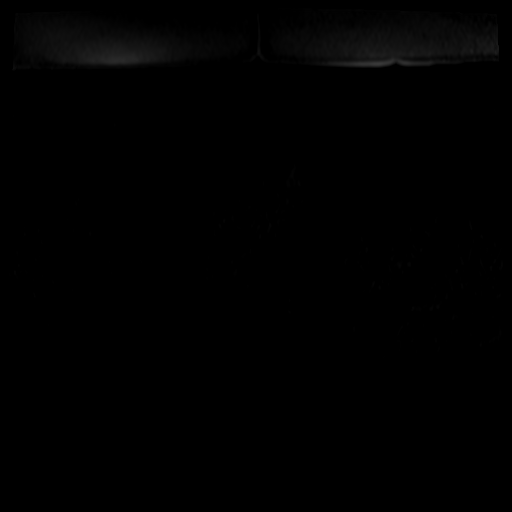
[im 48/48]
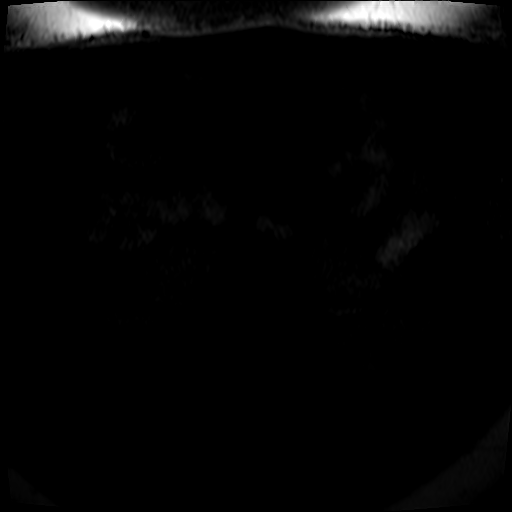

[Series 14: T1 dynamic post-contrast · axial · 4.0mm · 0.51mm/px · z∈[+1,+188]mm · 3 of 48 slices shown]
[im 1/48]
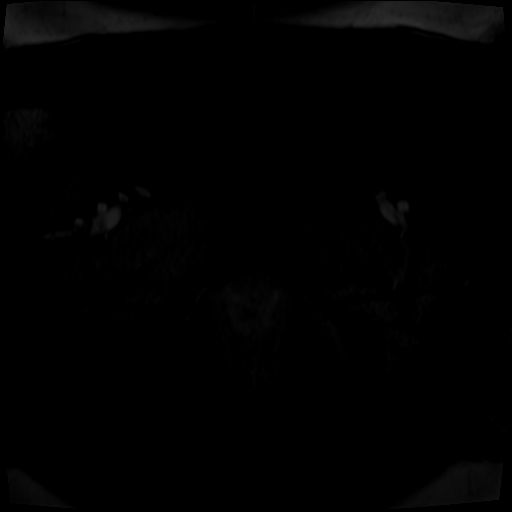
[im 24/48]
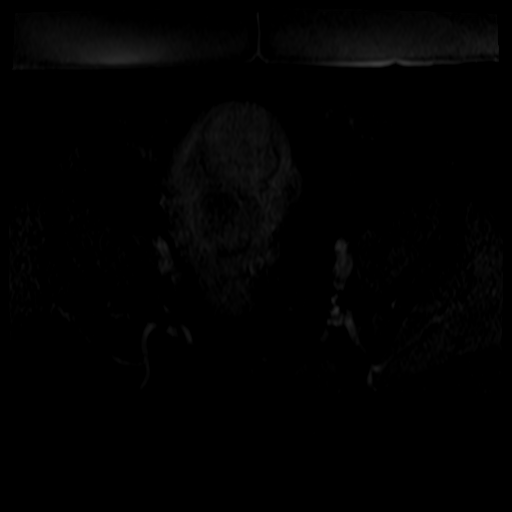
[im 48/48]
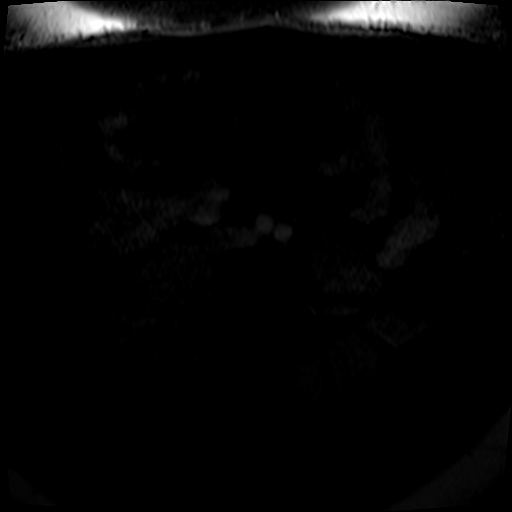

[Series 15: T1 dynamic · axial · 4.0mm · 0.51mm/px · z∈[+1,+92]mm · 2 of 48 slices shown (2 of 2)]
[im 1/48]
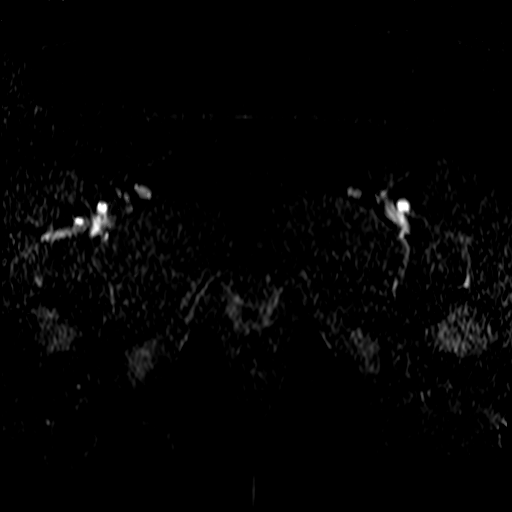
[im 24/48]
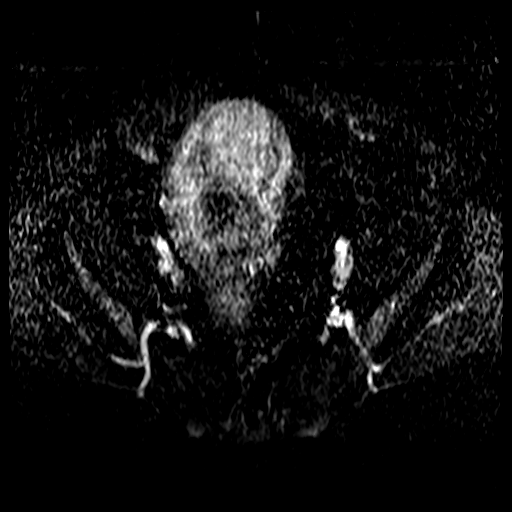

[28 of 48 positions shown; findings below may reference images not displayed]

FINDINGS: Urinary Tract: Smooth contour the urinary bladder. No distal
ureteral dilation.

Bowel: Some ascites amidst loops of bowel in the abdomen. No gross
acute bowel finding

Vascular/Lymphatic: No adenopathy in the pelvis. Vascular structures
are patent.

Reproductive:  Uterus measures 14.6 x 9.3 x 8.7 cm.

Numerous leiomyomata throughout the uterus. The large either
prolapsed or cervical leiomyoma measuring 3.7 x 3.7 x 3.9 cm (image
[DATE]). This appears intracavitary. Almost entirely submucosal.

Large leiomyoma in the uterine fundus along the RIGHT uterine fundus
measures 4.3 x 3.9 cm with a submucosal component submucosal
component is not the dominant interface.

Mid uterine/lower uterine segment 3.9 x 3.4 x 3.5 cm submucosal
leiomyoma, proximally 50-60% submucosal component.

(Image [DATE]) 4.1 x 3.9 x 3.5 cm intramural leiomyoma.

Above leiomyomata display low T2 signal and are well-circumscribed
and avidly enhancing, enhancing to this same degree surrounding
myometrium.

Other smaller leiomyomata with similar imaging features scattered
about the uterus.

There is a pedunculated/subserosal leiomyoma in the LEFT anterior
superior uterine fundus that measures 3.4 x 3.5 x 3.5 cm. This is
associated with or adjacent to a larger ill-defined cystic appearing
area that appears to show narrow contact and small pedicle arising
from the LEFT uterine fundus (image [DATE] showing relationships
between this presumed leiomyoma in this indeterminate LEFT
adnexal/Peri uterine mass.

(Image [DATE]) 11 x 5.9 cm LEFT Peri uterine mass with potential
connection with the LEFT lateral uterus near the fundus on this
image.

The LEFT ovary is immediately adjacent to and or involved with this
process. This is larger than 9292 where it measured approximately
9.8 x 5.6 cm. This is now associated with ascites. Lesion is
markedly complex with internal septations and increased T2 signal
and smaller cystic spaces. There is heterogeneous but low level
enhancement throughout the lesion.

RIGHT ovary is normal.

Other: Pelvic ascites. No frank nodularity exhibited elsewhere in
the pelvis.

Musculoskeletal: No suspicious bone lesions identified.
IMPRESSION: Large indeterminate LEFT adnexal/para-uterine mass with potential
connection to the LEFT lateral uterus, as described. This appears to
be associated with the uterus and involving or adjacent to a fundal
leiomyoma, also adjacent to the LEFT ovary with slow growth since
9292. Findings may reflect a pelvic sarcoma, cystic ovarian neoplasm
or perhaps sequela of remote torsed pedunculated fibroid with
variable degrees of necrosis/limited blood supply.

Concerning features associated with the above finding are the
heterogeneous enhancement pattern and the presence of ascites, for
this reason gyn consultation is suggested as neoplasm is the primary
differential consideration at this time, perhaps indolent given slow
enlargement since 9292.

Numerous leiomyomata with intracavitary/entirely submucosal cervical
leiomyoma versus is prolapsed leiomyoma extending from lower uterine
segment into the cervix.

Endometrium is distorted by numerous leiomyomata, not grossly
thickened or heterogeneous but not well assessed. Correlation with
direct visualization may ultimately be warranted, potentially given
the potential for prolapsed leiomyoma.

Normal RIGHT ovary.

These results will be called to the ordering clinician or
representative by the Radiologist Assistant, and communication
documented in the PACS or [REDACTED].

## 2023-01-05 ENCOUNTER — Other Ambulatory Visit: Payer: Self-pay | Admitting: Nurse Practitioner

## 2023-01-05 MED ORDER — METFORMIN HCL 500 MG PO TABS: 500.0000 mg | ORAL_TABLET | Freq: Every day | ORAL | 1 refills | Status: DC

## 2023-01-07 ENCOUNTER — Ambulatory Visit: Payer: No Typology Code available for payment source

## 2023-01-14 ENCOUNTER — Ambulatory Visit: Payer: No Typology Code available for payment source

## 2023-01-17 ENCOUNTER — Other Ambulatory Visit: Payer: Self-pay

## 2023-01-17 ENCOUNTER — Ambulatory Visit: Payer: No Typology Code available for payment source

## 2023-01-17 MED ORDER — SYNTHROID 75 MCG PO TABS: 75.0000 ug | ORAL_TABLET | Freq: Every day | ORAL | 2 refills | Status: DC

## 2023-01-17 MED ORDER — HYDROCHLOROTHIAZIDE 25 MG PO TABS: 25.0000 mg | ORAL_TABLET | Freq: Every day | ORAL | 1 refills | Status: DC

## 2023-01-17 NOTE — Progress Notes (Deleted)
Patient presents today for a BP check, patient currently taking HCTZ ,

## 2023-01-21 ENCOUNTER — Other Ambulatory Visit: Payer: Self-pay | Admitting: Chiropractic Medicine

## 2023-01-21 ENCOUNTER — Encounter: Payer: Self-pay | Admitting: Chiropractic Medicine

## 2023-01-21 DIAGNOSIS — M5412 Radiculopathy, cervical region: Secondary | ICD-10-CM

## 2023-01-27 ENCOUNTER — Encounter: Payer: Self-pay | Admitting: Chiropractic Medicine

## 2023-02-07 ENCOUNTER — Other Ambulatory Visit: Payer: No Typology Code available for payment source

## 2023-02-18 ENCOUNTER — Other Ambulatory Visit: Payer: Self-pay

## 2023-02-18 MED ORDER — SYNTHROID 75 MCG PO TABS: 75.0000 ug | ORAL_TABLET | Freq: Every day | ORAL | 2 refills | Status: DC

## 2023-02-21 ENCOUNTER — Ambulatory Visit
Admission: RE | Admit: 2023-02-21 | Discharge: 2023-02-21 | Disposition: A | Discharge status: Home / Self Care | Payer: No Typology Code available for payment source | Source: Ambulatory Visit | Attending: Chiropractic Medicine | Admitting: Chiropractic Medicine

## 2023-02-21 DIAGNOSIS — M5412 Radiculopathy, cervical region: Secondary | ICD-10-CM

## 2023-02-25 ENCOUNTER — Other Ambulatory Visit: Payer: Self-pay | Admitting: Nurse Practitioner

## 2023-03-13 IMAGING — CT CT ABD-PELV W/ CM
2 of 5 series · 16 of 46 positions shown, 18 images · IV contrast (Omni 300)
Comparison: 03/17/2019

CLINICAL DATA: Postop hysterectomy, abdominal pain

EXAM:
CT ABDOMEN AND PELVIS WITH CONTRAST
TECHNIQUE: Multidetector CT imaging of the abdomen and pelvis was performed
using the standard protocol following bolus administration of
intravenous contrast.
CONTRAST:  100mL OMNIPAQUE IOHEXOL 300 MG/ML  SOLN

[Series 3: a/p w/ 5mm · axial · 0.98mm/px · z∈[+861,+1251]mm · 13 of 88 slices shown, 15 images]
[im 5/88  soft-tissue]
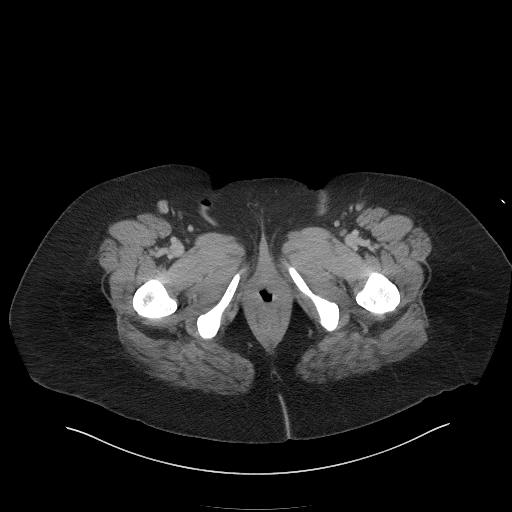
[im 5/88  bone]
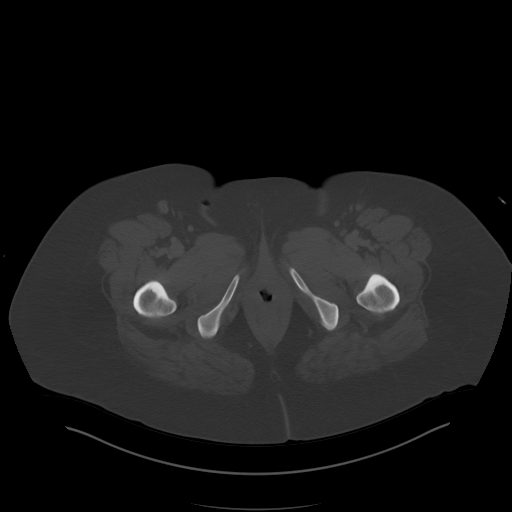
[im 14/88  soft-tissue]
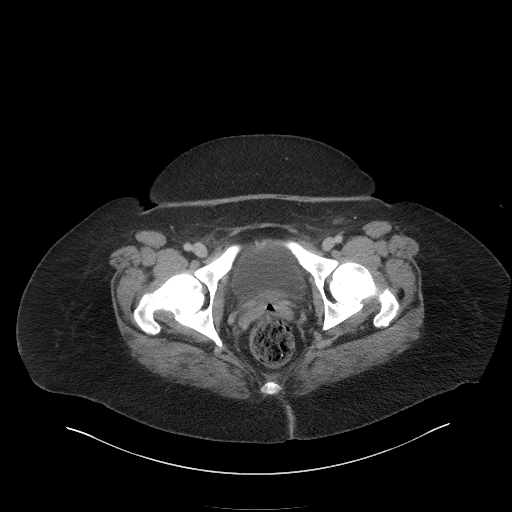
[im 19/88  soft-tissue]
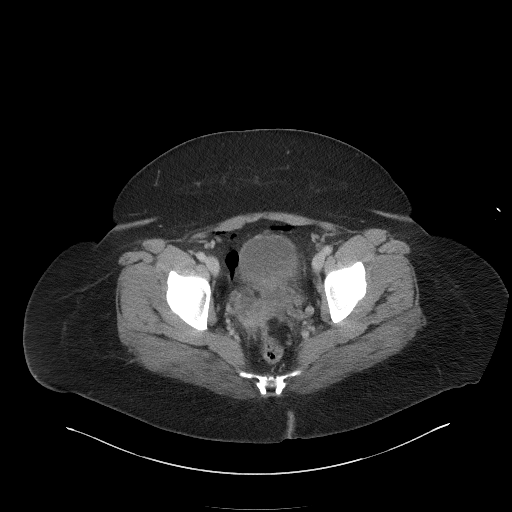
[im 23/88  soft-tissue]
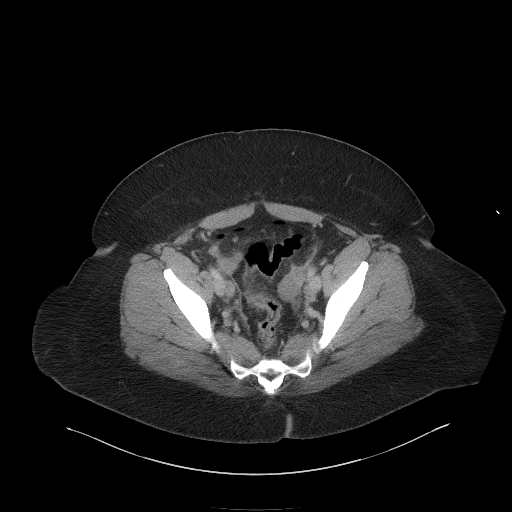
[im 33/88  soft-tissue]
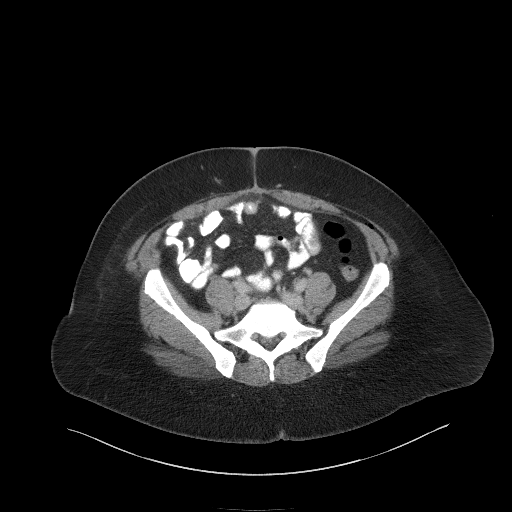
[im 37/88  soft-tissue]
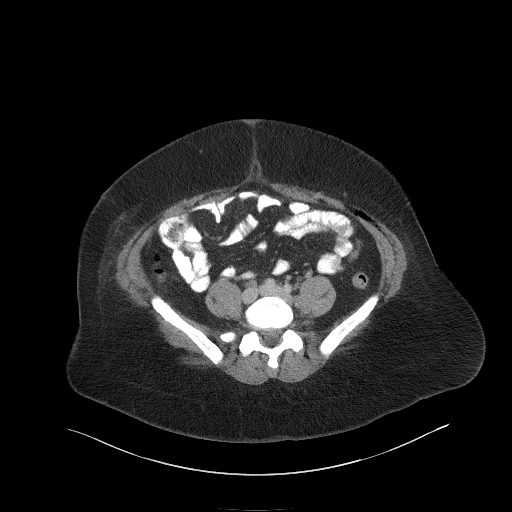
[im 46/88  soft-tissue]
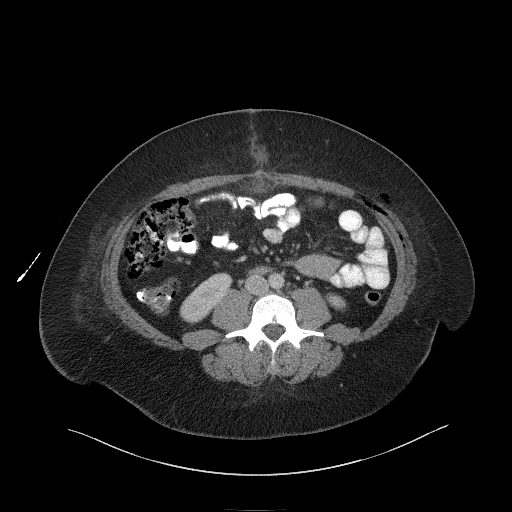
[im 51/88  soft-tissue]
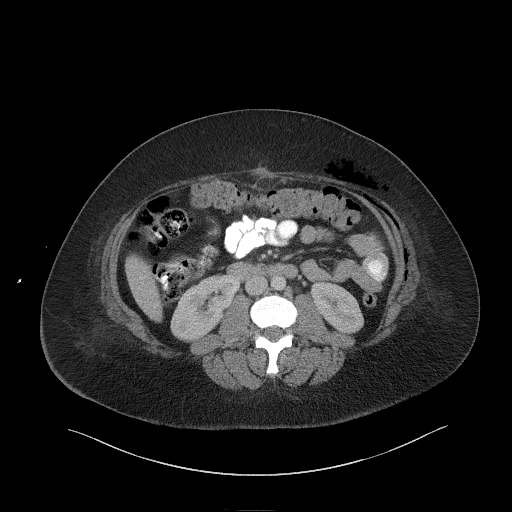
[im 55/88  soft-tissue]
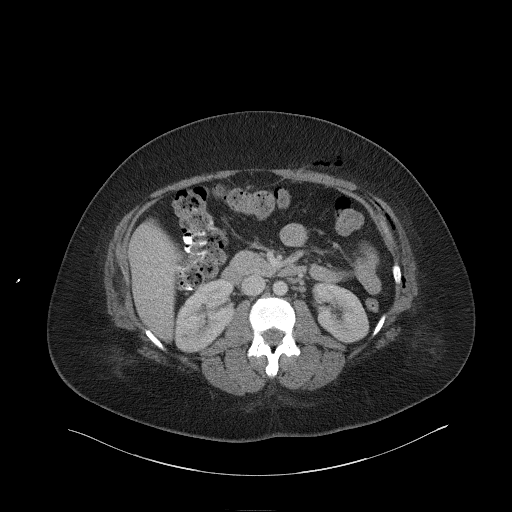
[im 55/88  bone]
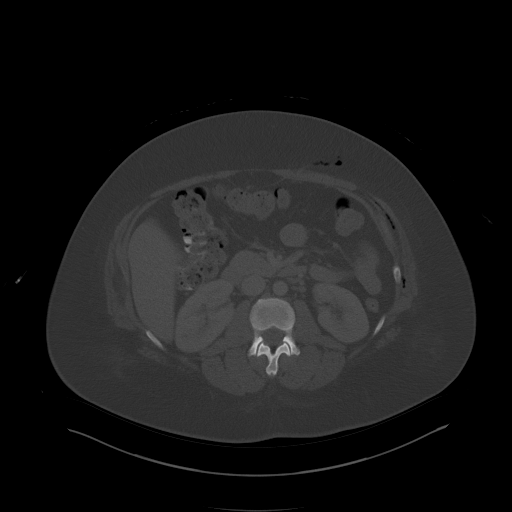
[im 65/88  soft-tissue]
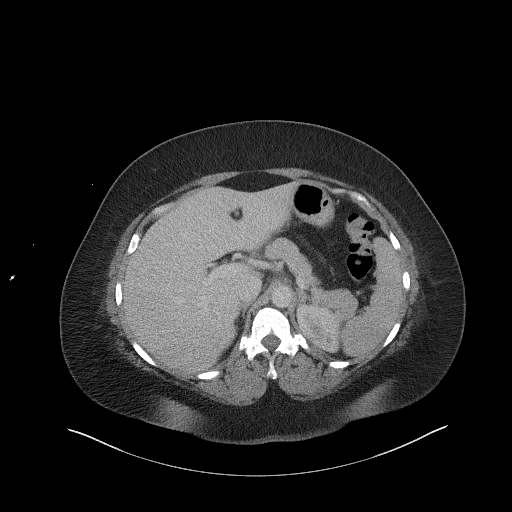
[im 69/88  soft-tissue]
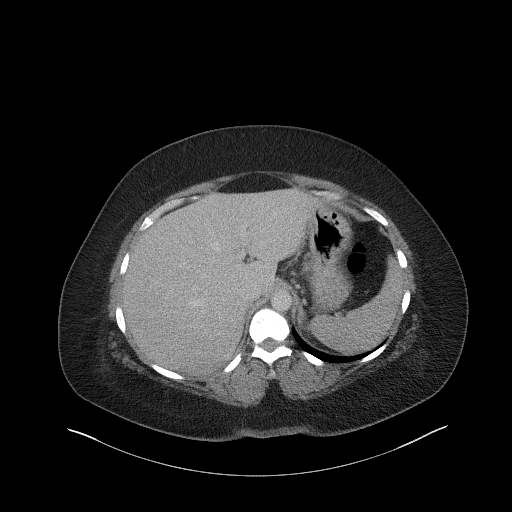
[im 74/88  soft-tissue]
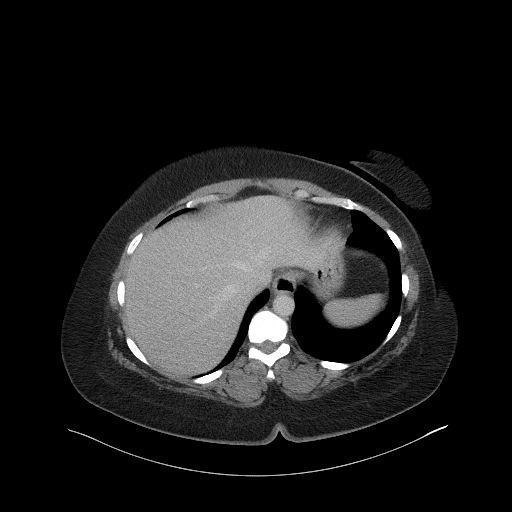
[im 83/88  soft-tissue]
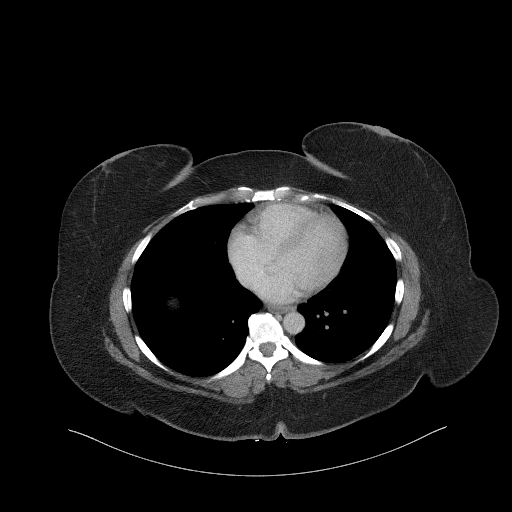

[Series 6: a/p w/ cor · coronal · 0.86mm/px · 3 of 178 slices shown]
[im 60/178  soft-tissue]
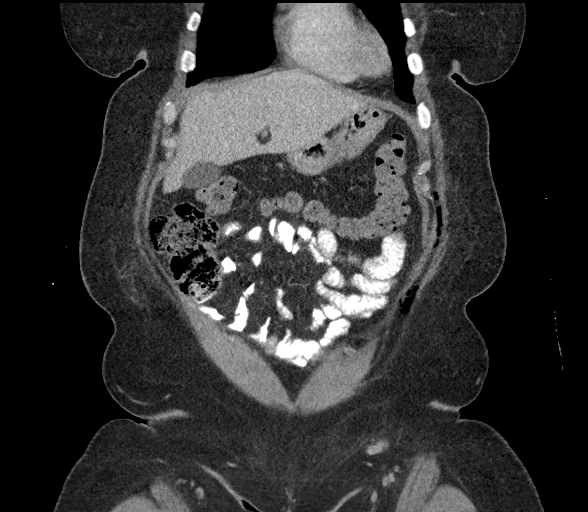
[im 79/178  soft-tissue]
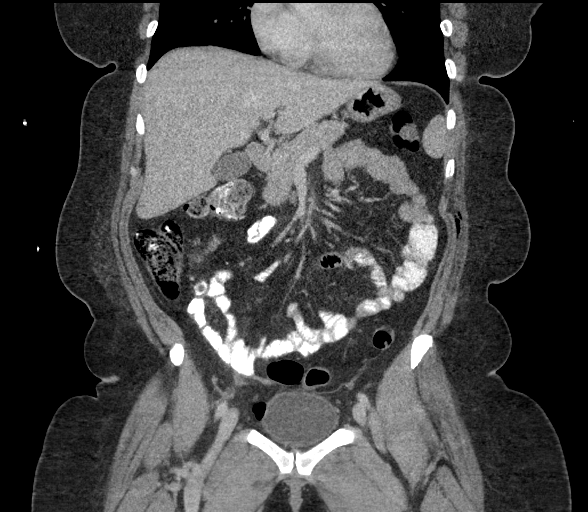
[im 99/178  soft-tissue]
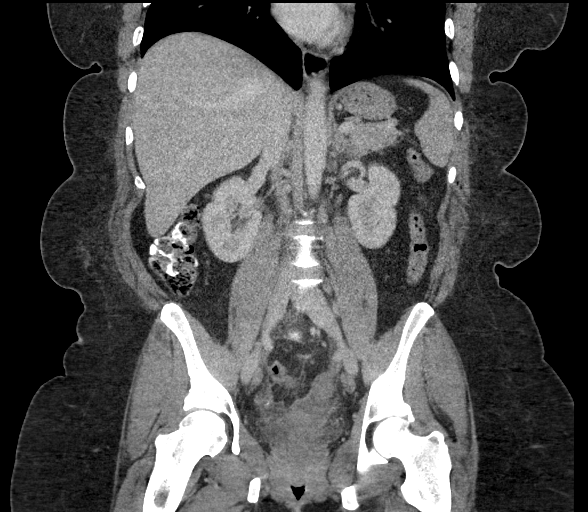

[16 of 46 positions shown; findings below may reference images not displayed]

FINDINGS: Lower chest: Lung bases are clear.

Hepatobiliary: Liver is within normal limits.

Gallbladder is unremarkable. No intrahepatic or extrahepatic ductal
dilatation.

Pancreas: Within normal limits.

Spleen: Within normal limits.

Adrenals/Urinary Tract: Adrenal glands are within normal limits.

Kidneys are within normal limits.  No hydronephrosis.

Bladder is within normal limits.

Stomach/Bowel: Stomach is within normal limits.

No evidence of bowel obstruction.

Appendix is not discretely visualized.

No colonic wall thickening or inflammatory changes.

Vascular/Lymphatic: No evidence of abdominal aortic aneurysm.

Small retroperitoneal and bilateral common iliac lymph nodes which
do not meet pathologic CT size criteria.

Reproductive: Status post hysterectomy. Minimal fluid/stranding in
the surgical bed, without drainable fluid collection/abscess.

Bilateral ovaries are within normal limits.

Other: No abdominopelvic ascites.

Mild extraperitoneal gas along the anterior pelvis (series 3/images
37 and 51).

Musculoskeletal: Visualized osseous structures are within normal
limits.
IMPRESSION: Postsurgical changes related to recent hysterectomy. No drainable
fluid collection/abscess.

## 2023-03-18 ENCOUNTER — Other Ambulatory Visit: Payer: Self-pay | Admitting: Nurse Practitioner

## 2023-04-08 ENCOUNTER — Encounter: Payer: Self-pay | Admitting: Internal Medicine

## 2023-05-05 ENCOUNTER — Other Ambulatory Visit: Payer: Self-pay | Admitting: Internal Medicine

## 2023-06-06 ENCOUNTER — Ambulatory Visit (HOSPITAL_COMMUNITY)
Admission: EM | Admit: 2023-06-06 | Discharge: 2023-06-06 | Disposition: A | Discharge status: Home / Self Care | Payer: No Typology Code available for payment source | Attending: Physician Assistant | Admitting: Physician Assistant

## 2023-06-06 ENCOUNTER — Encounter (HOSPITAL_COMMUNITY): Payer: Self-pay

## 2023-06-06 DIAGNOSIS — M545 Low back pain, unspecified: Secondary | ICD-10-CM

## 2023-06-06 LAB — POCT URINALYSIS DIP (MANUAL ENTRY)
Bilirubin, UA: NEGATIVE
Glucose, UA: NEGATIVE mg/dL
Ketones, POC UA: NEGATIVE mg/dL
Leukocytes, UA: NEGATIVE
Nitrite, UA: NEGATIVE
Protein Ur, POC: NEGATIVE mg/dL
Spec Grav, UA: 1.02 (ref 1.010–1.025)
Urobilinogen, UA: 0.2 U/dL
pH, UA: 5.5 (ref 5.0–8.0)

## 2023-06-06 MED ORDER — BACLOFEN 10 MG PO TABS: 10.0000 mg | ORAL_TABLET | Freq: Three times a day (TID) | ORAL | 2 refills | Status: AC

## 2023-06-06 NOTE — Discharge Instructions (Signed)
Can take muscle relaxer as needed. Recommend Tylenol or ibuprofen as needed. Recommend gentle stretching and ice to affected area. Recommend follow-up with your primary care physician.

## 2023-06-06 NOTE — ED Provider Notes (Signed)
[facility]    CSN: [id number] Arrival date & time: 06/06/23  1716      History   Chief Complaint Chief Complaint  Patient presents with   Back Pain    HPI Alyssa Oliver is a 47 y.o. female.   Pt complains of left lower back pain that started a few weeks ago and became worse over the last few days.  She denies injury or trauma.  Denies dysuria, abdominal pain,  fever, chills.  She has some radiation to her left buttocks.  She has tried ibuprofen with minimal relief.  She reports pain is worse with movement.  She works in [employer] and is on her feet a lot. She reports she was treated for a UTI last year, felt similar symptoms at that time.     Past Medical History:  Diagnosis Date   Acute appendicitis 03/17/2019   Adnexal mass    Diabetes mellitus without complication (HCC)    Fibroid uterus    Headache    Hypertension    Thyroid disease     Patient Active Problem List   Diagnosis Date Noted   Essential hypertension, benign 08/18/2022   Primary hypothyroidism 08/18/2022   Neck pain on right side 08/18/2022   Prediabetes 08/18/2022   Anemia 04/15/2022   Acute post-operative pain 09/14/2021   Uterine leiomyoma    Edema of left lower extremity 09/04/2021   Abnormal uterine bleeding (AUB) 07/30/2021   Adnexal mass 07/30/2021   BMI 45.0-49.9, adult (HCC) 07/30/2021   Hypertensive disorder 12/29/2020   Menorrhagia 01/10/2020   Trochanteric bursitis of left hip 09/14/2018   Pain of left hip joint 09/14/2018    Past Surgical History:  Procedure Laterality Date   ABDOMINAL HYSTERECTOMY     APPENDECTOMY  03/17/2019   CESAREAN SECTION     LAPAROSCOPIC APPENDECTOMY N/A 03/17/2019   Procedure: APPENDECTOMY LAPAROSCOPIC;  Surgeon: Abigail Miyamoto, MD;  Location: [facility];  Service: General;  Laterality: N/A;   ROBOTIC ASSISTED LAPAROSCOPIC HYSTERECTOMY AND SALPINGECTOMY N/A 09/06/2021   Procedure: XI ROBOTIC ASSISTED LAPAROSCOPIC HYSTERECTOMY GREATER THAN TWO  HUNDRED AND FIFTY GRAMS, BILATERAL SALPINGECTOMY,MINI LAPAROTOMY FOR SPECIMEN REMOVAL, CYSTOSCOPY;  Surgeon: Carver Fila, MD;  Location: [facility];  Service: Gynecology;  Laterality: N/A;    OB History     Gravida  3   Para  2   Term      Preterm      AB  1   Living         SAB      IAB      Ectopic      Multiple      Live Births               Home Medications    Prior to Admission medications   Medication Sig Start Date End Date Taking? Authorizing Provider  FERROCITE 324 MG TABS tablet Take 1 tablet by mouth daily. 04/15/22  Yes [provider]  hydrochlorothiazide (HYDRODIURIL) 25 MG tablet Take 1 tablet (25 mg total) by mouth daily. 01/17/23  Yes Dorothyann Peng, MD  ibuprofen (ADVIL) 600 MG tablet TAKE 1 TABLET BY MOUTH EVERY 6 HOURS AS NEEDED 04/23/23  Yes Arnette Felts, FNP  metFORMIN (GLUCOPHAGE) 500 MG tablet Take 1 tablet (500 mg total) by mouth daily with breakfast. 01/05/23  Yes Arnette Felts, FNP  SYNTHROID 75 MCG tablet Take 1 tablet (75 mcg total) by mouth daily. 02/18/23 02/18/24 Yes Dorothyann Peng, MD  baclofen (LIORESAL) 10  MG tablet Take 1 tablet (10 mg total) by mouth 3 (three) times daily. 06/06/23   Ward, Tylene Fantasia, PA-C  potassium chloride SA (KLOR-CON M) 20 MEQ tablet Take 1 tablet (20 mEq total) by mouth 2 (two) times daily for 2 doses. Patient not taking: Reported on 08/12/2022 09/03/21 09/06/21  Warner Mccreedy D, NP    Family History Family History  Problem Relation Age of Onset   Colon cancer Neg Hx    Breast cancer Neg Hx    Ovarian cancer Neg Hx    Endometrial cancer Neg Hx    Pancreatic cancer Neg Hx    Prostate cancer Neg Hx     Social History Social History   Tobacco Use   Smoking status: Never   Smokeless tobacco: Never  Vaping Use   Vaping status: Never Used  Substance Use Topics   Alcohol use: Not Currently   Drug use: Never     Allergies   Penicillins and Sulfa antibiotics   Review of Systems Review  of Systems   Physical Exam Triage Vital Signs ED Triage Vitals [06/06/23 1751]  Encounter Vitals Group     BP (!) 149/85     Systolic BP Percentile      Diastolic BP Percentile      Pulse Rate 78     Resp 16     Temp 98.4 F (36.9 C)     Temp Source Oral     SpO2 98 %     Weight      Height      Head Circumference      Peak Flow      Pain Score      Pain Loc      Pain Education      Exclude from Growth Chart    No data found.  Updated Vital Signs BP (!) 149/85 (BP Location: Left Arm)   Pulse 78   Temp 98.4 F (36.9 C) (Oral)   Resp 16   LMP 08/24/2021 (Exact Date)   SpO2 98%   Visual Acuity Right Eye Distance:   Left Eye Distance:   Bilateral Distance:    Right Eye Near:   Left Eye Near:    Bilateral Near:     Physical Exam   UC Treatments / Results  Labs (all labs ordered are listed, but only abnormal results are displayed) Labs Reviewed  POCT URINALYSIS DIP (MANUAL ENTRY) - Abnormal; Notable for the following components:      Result Value   Clarity, UA cloudy (*)    Blood, UA moderate (*)    All other components within normal limits  URINE CULTURE    EKG   Radiology No results found.  Procedures Procedures (including critical care time)  Medications Ordered in UC Medications - No data to display  Initial Impression / Assessment and Plan / UC Course  I have reviewed the triage vital signs and the nursing notes.  Pertinent labs & imaging results that were available during my care of the patient were reviewed by me and considered in my medical decision making (see chart for details).     At this time low back pain likely musculoskeletal in nature.  UA normal.  Supportive care discussed.  Muscle relaxer prescribed.  Work note given.  No red flag symptoms in clinic today. Final Clinical Impressions(s) / UC Diagnoses   Final diagnoses:  Acute left-sided low back pain, unspecified whether sciatica present     Discharge Instructions  Can take muscle relaxer as needed. Recommend Tylenol or ibuprofen as needed. Recommend gentle stretching and ice to affected area. Recommend follow-up with your primary care physician.     ED Prescriptions     Medication Sig Dispense Auth. Provider   baclofen (LIORESAL) 10 MG tablet Take 1 tablet (10 mg total) by mouth 3 (three) times daily. 30 tablet Ward, Tylene Fantasia, PA-C      PDMP not reviewed this encounter.   Ward, Tylene Fantasia, PA-C 06/06/23 1905

## 2023-06-08 LAB — URINE CULTURE

## 2023-08-13 ENCOUNTER — Encounter: Payer: Self-pay | Admitting: Internal Medicine

## 2023-08-13 ENCOUNTER — Ambulatory Visit: Payer: No Typology Code available for payment source | Admitting: Internal Medicine

## 2023-08-13 VITALS — BP 132/84 | HR 92 | Temp 98.0°F | Ht 63.0 in | Wt 256.2 lb

## 2023-08-13 DIAGNOSIS — I10 Essential (primary) hypertension: Secondary | ICD-10-CM

## 2023-08-13 DIAGNOSIS — Z6841 Body Mass Index (BMI) 40.0 and over, adult: Secondary | ICD-10-CM

## 2023-08-13 DIAGNOSIS — E039 Hypothyroidism, unspecified: Secondary | ICD-10-CM

## 2023-08-13 DIAGNOSIS — R7303 Prediabetes: Secondary | ICD-10-CM

## 2023-08-13 DIAGNOSIS — E66813 Obesity, class 3: Secondary | ICD-10-CM | POA: Diagnosis not present

## 2023-08-13 DIAGNOSIS — Z1211 Encounter for screening for malignant neoplasm of colon: Secondary | ICD-10-CM

## 2023-08-13 DIAGNOSIS — Z0000 Encounter for general adult medical examination without abnormal findings: Secondary | ICD-10-CM | POA: Insufficient documentation

## 2023-08-13 LAB — POCT URINALYSIS DIPSTICK
Bilirubin, UA: NEGATIVE
Glucose, UA: NEGATIVE
Ketones, UA: NEGATIVE
Leukocytes, UA: NEGATIVE
Nitrite, UA: NEGATIVE
Protein, UA: NEGATIVE
Spec Grav, UA: >=1.03 — AB (ref 1.010–1.025)
Urobilinogen, UA: 0.2 U/dL
pH, UA: 5.5 (ref 5.0–8.0)

## 2023-08-13 NOTE — Progress Notes (Signed)
I,Victoria T Deloria Lair, CMA,acting as a Neurosurgeon for Gwynneth Aliment, MD.,have documented all relevant documentation on the behalf of Gwynneth Aliment, MD,as directed by  Gwynneth Aliment, MD while in the presence of Gwynneth Aliment, MD.  Subjective:    Patient ID: Alyssa Oliver , female    DOB: 1976/03/26 , 47 y.o.   MRN: [id number]  Chief Complaint  Patient presents with   Annual Exam   Hypertension   Hypothyroidism   Prediabetes    HPI  Patient presents today for annual exam. She reports compliance with medications. Denies headache, chest pain & SOB. GYN: Dr Mindi Slicker. She has no specific concerns or complaints at this time.     Hypertension This is a chronic problem. The current episode started more than 1 year ago. The problem has been gradually improving since onset. Pertinent negatives include no blurred vision, chest pain, palpitations or shortness of breath. Risk factors for coronary artery disease include obesity. Past treatments include diuretics. The current treatment provides moderate improvement. There is no history of kidney disease. There is no history of hyperaldosteronism or hypercortisolism.  x   Past Medical History:  Diagnosis Date   Acute appendicitis 03/17/2019   Adnexal mass    Diabetes mellitus without complication (HCC)    Fibroid uterus    Headache    Hypertension    Thyroid disease      Family History  Problem Relation Age of Onset   Colon cancer Neg Hx    Breast cancer Neg Hx    Ovarian cancer Neg Hx    Endometrial cancer Neg Hx    Pancreatic cancer Neg Hx    Prostate cancer Neg Hx      Current Outpatient Medications:    baclofen (LIORESAL) 10 MG tablet, Take 1 tablet (10 mg total) by mouth 3 (three) times daily., Disp: 30 tablet, Rfl: 2   FERROCITE 324 MG TABS tablet, Take 1 tablet by mouth daily., Disp: , Rfl:    hydrochlorothiazide (HYDRODIURIL) 25 MG tablet, Take 1 tablet (25 mg total) by mouth daily., Disp: 90 tablet, Rfl: 1   ibuprofen (ADVIL)  600 MG tablet, TAKE 1 TABLET BY MOUTH EVERY 6 HOURS AS NEEDED, Disp: 30 tablet, Rfl: 0   SYNTHROID 75 MCG tablet, Take 1 tablet (75 mcg total) by mouth daily., Disp: 30 tablet, Rfl: 2   Allergies  Allergen Reactions   Penicillins Itching   Sulfa Antibiotics Itching      The patient states she uses status post hysterectomy for birth control. Patient's last menstrual period was 08/24/2021 (exact date).. Negative for Dysmenorrhea. Negative for: breast discharge, breast lump(s), breast pain and breast self exam. Associated symptoms include abnormal vaginal bleeding. Pertinent negatives include abnormal bleeding (hematology), anxiety, decreased libido, depression, difficulty falling sleep, dyspareunia, history of infertility, nocturia, sexual dysfunction, sleep disturbances, urinary incontinence, urinary urgency, vaginal discharge and vaginal itching. Diet regular.The patient states her exercise level is  moderate.  . The patient's tobacco use is:  Social History   Tobacco Use  Smoking Status Never  Smokeless Tobacco Never  . She has been exposed to passive smoke. The patient's alcohol use is:  Social History   Substance and Sexual Activity  Alcohol Use Not Currently    Review of Systems  Constitutional: Negative.   HENT: Negative.    Eyes: Negative.  Negative for blurred vision.  Respiratory: Negative.  Negative for shortness of breath.   Cardiovascular: Negative.  Negative for chest pain and palpitations.  Gastrointestinal:  Negative.   Endocrine: Negative.   Genitourinary: Negative.   Musculoskeletal: Negative.   Skin: Negative.   Allergic/Immunologic: Negative.   Neurological: Negative.   Hematological: Negative.   Psychiatric/Behavioral: Negative.       Today's Vitals   08/13/23 1523  BP: 132/84  Pulse: 92  Temp: 98 F (36.7 C)  SpO2: 98%  Weight: 256 lb 3.2 oz (116.2 kg)  Height: 5\' 3"  (1.6 m)   Body mass index is 45.38 kg/m.  Wt Readings from Last 3 Encounters:   08/13/23 256 lb 3.2 oz (116.2 kg)  12/17/22 256 lb (116.1 kg)  08/12/22 259 lb 12.8 oz (117.8 kg)    BP Readings from Last 3 Encounters:  08/13/23 132/84  06/06/23 (!) 149/85  12/17/22 132/78    Objective:  Physical Exam Vitals and nursing note reviewed.  Constitutional:      Appearance: Normal appearance. She is obese.  HENT:     Head: Normocephalic and atraumatic.     Right Ear: Tympanic membrane, ear canal and external ear normal.     Left Ear: Tympanic membrane, ear canal and external ear normal.     Nose: Nose normal.     Mouth/Throat:     Mouth: Mucous membranes are moist.     Pharynx: Oropharynx is clear.  Eyes:     Extraocular Movements: Extraocular movements intact.     Conjunctiva/sclera: Conjunctivae normal.     Pupils: Pupils are equal, round, and reactive to light.  Cardiovascular:     Rate and Rhythm: Normal rate and regular rhythm.     Pulses: Normal pulses.     Heart sounds: Normal heart sounds.  Pulmonary:     Effort: Pulmonary effort is normal.     Breath sounds: Normal breath sounds.  Chest:  Breasts:    Tanner Score is 5.     Right: Normal.     Left: Normal.  Abdominal:     General: Bowel sounds are normal.     Palpations: Abdomen is soft.     Comments: Rounded, soft  Genitourinary:    Comments: deferred Musculoskeletal:        General: Normal range of motion.     Cervical back: Normal range of motion and neck supple.  Skin:    General: Skin is warm and dry.  Neurological:     General: No focal deficit present.     Mental Status: She is alert and oriented to person, place, and time.  Psychiatric:        Mood and Affect: Mood normal.        Behavior: Behavior normal.         Assessment And Plan:     Encounter for annual health examination Assessment & Plan: A full exam was performed.  Importance of monthly self breast exams was discussed with the patient.  She is advised to get 30-45 minutes of regular exercise, no less than four to  five days per week. Both weight-bearing and aerobic exercises are recommended.  She is advised to follow a healthy diet with at least six fruits/veggies per day, decrease intake of red meat and other saturated fats and to increase fish intake to twice weekly.  Meats/fish should not be fried -- baked, boiled or broiled is preferable. It is also important to cut back on your sugar intake.  Be sure to read labels - try to avoid anything with added sugar, high fructose corn syrup or other sweeteners.  If you must use a  sweetener, you can try stevia or monkfruit.  It is also important to avoid artificially sweetened foods/beverages and diet drinks. Lastly, wear SPF 50 sunscreen on exposed skin and when in direct sunlight for an extended period of time.  Be sure to avoid fast food restaurants and aim for at least 60 ounces of water daily.      Orders: -     CBC -     CMP14+EGFR -     Lipid panel -     Hemoglobin A1c -     TSH + free T4  Essential hypertension, benign Assessment & Plan: Chronic, fair control. EKG performed, NSR w/o acute changes. She will continue with hydrochlorothiazide 25mg  daily. Encouraged to follow low sodium diet. She will f/u in six months for re-evaluation.   Orders: -     POCT urinalysis dipstick -     Microalbumin / creatinine urine ratio -     EKG 12-Lead  Primary hypothyroidism Assessment & Plan: Chronic, she will continue with Synthroid daily for now. I will check thyroid panel and adjust meds as needed.    Class 3 severe obesity due to excess calories with serious comorbidity and body mass index (BMI) of 45.0 to 49.9 in adult [facility]) Assessment & Plan: She is encouraged to strive to lose ten percent of her body weight in order to decrease cardiac risk. Encouraged to aim for at least 150 minutes of exercise per week.    Screening for colon cancer Assessment & Plan: She does not wish to have a colonoscopy. She does agree to [employer] testing. She verbalizes  understanding that she will need to have a colonoscopy if the Cologuard results are positive.    Orders: -     Cologuard  She is encouraged to strive for BMI less than 30 to decrease cardiac risk. Advised to aim for at least 150 minutes of exercise per week.    Return for 1 year physical, 4 month DM. Patient was given opportunity to ask questions. Patient verbalized understanding of the plan and was able to repeat key elements of the plan. All questions were answered to their satisfaction.    I, Gwynneth Aliment, MD, have reviewed all documentation for this visit. The documentation on 08/13/23 for the exam, diagnosis, procedures, and orders are all accurate and complete.

## 2023-08-13 NOTE — Patient Instructions (Signed)

## 2023-08-14 ENCOUNTER — Encounter: Payer: Self-pay | Admitting: Internal Medicine

## 2023-08-14 LAB — CMP14+EGFR
ALT: 17 [IU]/L (ref 0–32)
AST: 23 [IU]/L (ref 0–40)
Albumin: 4.6 g/dL (ref 3.9–4.9)
Alkaline Phosphatase: 108 [IU]/L (ref 44–121)
BUN/Creatinine Ratio: 21 (ref 9–23)
BUN: 17 mg/dL (ref 6–24)
Bilirubin Total: <0.2 mg/dL (ref 0.0–1.2)
CO2: 27 mmol/L (ref 20–29)
Calcium: 10.2 mg/dL (ref 8.7–10.2)
Chloride: 102 mmol/L (ref 96–106)
Creatinine, Ser: 0.82 mg/dL (ref 0.57–1.00)
Globulin, Total: 2.7 g/dL (ref 1.5–4.5)
Glucose: 118 mg/dL — ABNORMAL HIGH (ref 70–99)
Potassium: 3.8 mmol/L (ref 3.5–5.2)
Sodium: 142 mmol/L (ref 134–144)
Total Protein: 7.3 g/dL (ref 6.0–8.5)
eGFR: 90 mL/min/{1.73_m2} (ref >59)

## 2023-08-14 LAB — LIPID PANEL
Chol/HDL Ratio: 3.5 ratio (ref 0.0–4.4)
Cholesterol, Total: 166 mg/dL (ref 100–199)
HDL: 47 mg/dL (ref >39)
LDL Chol Calc (NIH): 103 mg/dL — ABNORMAL HIGH (ref 0–99)
Triglycerides: 87 mg/dL (ref 0–149)
VLDL Cholesterol Cal: 16 mg/dL (ref 5–40)

## 2023-08-14 LAB — TSH+FREE T4
Free T4: 1.31 ng/dL (ref 0.82–1.77)
TSH: 3.52 u[IU]/mL (ref 0.450–4.500)

## 2023-08-14 LAB — CBC
Hematocrit: 39.9 % (ref 34.0–46.6)
Hemoglobin: 13.1 g/dL (ref 11.1–15.9)
MCH: 29.2 pg (ref 26.6–33.0)
MCHC: 32.8 g/dL (ref 31.5–35.7)
MCV: 89 fL (ref 79–97)
Platelets: 369 10*3/uL (ref 150–450)
RBC: 4.48 x10E6/uL (ref 3.77–5.28)
RDW: 13.4 % (ref 11.7–15.4)
WBC: 6.6 10*3/uL (ref 3.4–10.8)

## 2023-08-14 LAB — MICROALBUMIN / CREATININE URINE RATIO
Creatinine, Urine: 99.9 mg/dL
Microalb/Creat Ratio: <3 mg/g{creat} (ref 0–29)
Microalbumin, Urine: <3 ug/mL

## 2023-08-14 LAB — HEMOGLOBIN A1C
Est. average glucose Bld gHb Est-mCnc: 137 mg/dL
Hgb A1c MFr Bld: 6.4 % — ABNORMAL HIGH (ref 4.8–5.6)

## 2023-08-23 DIAGNOSIS — Z1211 Encounter for screening for malignant neoplasm of colon: Secondary | ICD-10-CM | POA: Insufficient documentation

## 2023-08-23 NOTE — Assessment & Plan Note (Signed)
She does not wish to have a colonoscopy. She does agree to [employer] testing. She verbalizes understanding that she will need to have a colonoscopy if the Cologuard results are positive.

## 2023-08-23 NOTE — Assessment & Plan Note (Signed)
She is encouraged to strive to lose ten percent of her body weight in order to decrease cardiac risk. Encouraged to aim for at least 150 minutes of exercise per week.

## 2023-08-23 NOTE — Assessment & Plan Note (Signed)
Chronic, fair control. EKG performed, NSR w/o acute changes. She will continue with hydrochlorothiazide 25mg  daily. Encouraged to follow low sodium diet. She will f/u in six months for re-evaluation.

## 2023-08-23 NOTE — Assessment & Plan Note (Signed)

## 2023-08-23 NOTE — Assessment & Plan Note (Signed)
Chronic, she will continue with Synthroid daily for now. I will check thyroid panel and adjust meds as needed.

## 2023-08-23 NOTE — Assessment & Plan Note (Signed)
Previous labs reviewed, her A1c has been elevated in the past. I will check an A1c today. Reminded to avoid refined sugars including sugary drinks/foods and processed meats including bacon, sausages and deli meats.  

## 2023-09-12 ENCOUNTER — Other Ambulatory Visit: Payer: Self-pay | Admitting: Internal Medicine

## 2023-10-13 ENCOUNTER — Encounter (HOSPITAL_COMMUNITY): Payer: Self-pay

## 2023-10-13 ENCOUNTER — Ambulatory Visit (HOSPITAL_COMMUNITY)
Admission: EM | Admit: 2023-10-13 | Discharge: 2023-10-13 | Disposition: A | Discharge status: Home / Self Care | Payer: No Typology Code available for payment source | Attending: Family Medicine | Admitting: Family Medicine

## 2023-10-13 DIAGNOSIS — S29011A Strain of muscle and tendon of front wall of thorax, initial encounter: Secondary | ICD-10-CM

## 2023-10-13 DIAGNOSIS — R0789 Other chest pain: Secondary | ICD-10-CM | POA: Diagnosis not present

## 2023-10-13 MED ORDER — KETOROLAC TROMETHAMINE 30 MG/ML IJ SOLN
30.0000 mg | Freq: Once | INTRAMUSCULAR | Status: AC
  Administered 2023-10-13: 30 mg via INTRAMUSCULAR

## 2023-10-13 MED ORDER — DEXAMETHASONE SODIUM PHOSPHATE 10 MG/ML IJ SOLN
10.0000 mg | Freq: Once | INTRAMUSCULAR | Status: AC
  Administered 2023-10-13: 10 mg via INTRAMUSCULAR

## 2023-10-13 MED ORDER — PREDNISONE 10 MG PO TABS
40.0000 mg | ORAL_TABLET | Freq: Every day | ORAL | 0 refills | Status: DC
Start: 2023-10-13 — End: 2023-10-13

## 2023-10-13 MED ORDER — TIZANIDINE HCL 4 MG PO TABS: 4.0000 mg | ORAL_TABLET | Freq: Four times a day (QID) | ORAL | 0 refills | Status: DC | PRN

## 2023-10-13 MED ORDER — PREDNISONE 20 MG PO TABS
40.0000 mg | ORAL_TABLET | Freq: Every day | ORAL | 0 refills | Status: AC
Start: 2023-10-13 — End: 2023-10-18

## 2023-10-13 MED ORDER — DEXAMETHASONE SODIUM PHOSPHATE 10 MG/ML IJ SOLN
INTRAMUSCULAR | Status: AC
  Filled 2023-10-13: qty 1

## 2023-10-13 MED ORDER — KETOROLAC TROMETHAMINE 30 MG/ML IJ SOLN
INTRAMUSCULAR | Status: AC
  Filled 2023-10-13: qty 1

## 2023-10-13 NOTE — ED Triage Notes (Signed)
 Pt c/o lt sided chest pain radiating through to back x2 days. States pain worse today and causing SOB. NAD noted. States took muscle relaxants with some relief.

## 2023-10-13 NOTE — Discharge Instructions (Addendum)
 Symptoms are more consistent with a musculoskeletal type pain given that the symptoms are reproducible with palpitation and improve or worsen with certain positions.  The EKG done today is stable when compared to the EKG done at primary care in November.  We will treat with the following and advise return to the emergency room if symptoms worsen: Decadron  injection given today. This is a steroid to help with inflammation and pain. Toradol  injection given today. This is a medication to help with pain. This is not a narcotic.  Prednisone  40 mg daily for 5 days. Take this in the morning. Zanaflex  4 mg every 6 hours as needed for muscle spasms. Use caution as this medication can cause drowsiness.  If chest pain worsens, then recommend going to the ER for further evaluation.

## 2023-10-13 NOTE — ED Provider Notes (Signed)
 [facility]    CSN: [id number] Arrival date & time: 10/13/23  1629      History   Chief Complaint Chief Complaint  Patient presents with   Chest Pain    HPI Alyssa Oliver is a 48 y.o. female.   48 year old female who presents to urgent care with 3 to 4-day history of left-sided chest pain.  This just started 1 day insidiously.  This does tend to be positional and she has not wanted to lay on that side due to this.  She has noted improvement in symptoms with certain positions.  The pain can be sharp at times.  She can feel the pain especially with breathing.  She does have a history of some injury to that side in the past that she sees a land for.  She denies any history of myocardial infarction.  She did see her primary care doctor in November and had an EKG done at that time due to her history of hypertension.  When compared to her EKG today they are similar.   Chest Pain Associated symptoms: no abdominal pain, no back pain, no cough, no fever, no palpitations, no shortness of breath and no vomiting     Past Medical History:  Diagnosis Date   Acute appendicitis 03/17/2019   Adnexal mass    Diabetes mellitus without complication (HCC)    Fibroid uterus    Headache    Hypertension    Thyroid  disease     Patient Active Problem List   Diagnosis Date Noted   Screening for colon cancer 08/23/2023   Encounter for annual health examination 08/13/2023   Class 3 severe obesity due to excess calories with serious comorbidity and body mass index (BMI) of 45.0 to 49.9 in adult [facility]) 08/13/2023   Essential hypertension, benign 08/18/2022   Primary hypothyroidism 08/18/2022   Neck pain on right side 08/18/2022   Prediabetes 08/18/2022   Anemia 04/15/2022   Acute post-operative pain 09/14/2021   Uterine leiomyoma    Edema of left lower extremity 09/04/2021   Abnormal uterine bleeding (AUB) 07/30/2021   Adnexal mass 07/30/2021   BMI 45.0-49.9, adult (HCC)  07/30/2021   Hypertensive disorder 12/29/2020   Menorrhagia 01/10/2020   Trochanteric bursitis of left hip 09/14/2018   Pain of left hip joint 09/14/2018    Past Surgical History:  Procedure Laterality Date   ABDOMINAL HYSTERECTOMY     APPENDECTOMY  03/17/2019   CESAREAN SECTION     LAPAROSCOPIC APPENDECTOMY N/A 03/17/2019   Procedure: APPENDECTOMY LAPAROSCOPIC;  Surgeon: Vernetta Berg, MD;  Location: [facility];  Service: General;  Laterality: N/A;   ROBOTIC ASSISTED LAPAROSCOPIC HYSTERECTOMY AND SALPINGECTOMY N/A 09/06/2021   Procedure: XI ROBOTIC ASSISTED LAPAROSCOPIC HYSTERECTOMY GREATER THAN TWO HUNDRED AND FIFTY GRAMS, BILATERAL SALPINGECTOMY,MINI LAPAROTOMY FOR SPECIMEN REMOVAL, CYSTOSCOPY;  Surgeon: Viktoria Comer SAUNDERS, MD;  Location: [facility];  Service: Gynecology;  Laterality: N/A;    OB History     Gravida  3   Para  2   Term      Preterm      AB  1   Living         SAB      IAB      Ectopic      Multiple      Live Births               Home Medications    Prior to Admission medications   Medication Sig Start Date End Date Taking?  Authorizing Provider  predniSONE  (DELTASONE ) 10 MG tablet Take 4 tablets (40 mg total) by mouth daily with breakfast for 5 days. Start 10/14/23 10/13/23 10/18/23 Yes Zavian Slowey A, PA-C  tiZANidine  (ZANAFLEX ) 4 MG tablet Take 1 tablet (4 mg total) by mouth every 6 (six) hours as needed for muscle spasms. 10/13/23  Yes Navil Kole A, PA-C  baclofen  (LIORESAL ) 10 MG tablet Take 1 tablet (10 mg total) by mouth 3 (three) times daily. 06/06/23   Ward, Harlene PEDLAR, PA-C  FERROCITE 324 MG TABS tablet Take 1 tablet by mouth daily. 04/15/22   [provider]  hydrochlorothiazide  (HYDRODIURIL ) 25 MG tablet TAKE 1 TABLET (25 MG TOTAL) BY MOUTH DAILY. 09/12/23   Jarold Medici, MD  ibuprofen  (ADVIL ) 600 MG tablet TAKE 1 TABLET BY MOUTH EVERY 6 HOURS AS NEEDED 04/23/23   Georgina Speaks, FNP  SYNTHROID  75 MCG tablet Take 1 tablet  (75 mcg total) by mouth daily. 02/18/23 02/18/24  Jarold Medici, MD    Family History Family History  Problem Relation Age of Onset   Colon cancer Neg Hx    Breast cancer Neg Hx    Ovarian cancer Neg Hx    Endometrial cancer Neg Hx    Pancreatic cancer Neg Hx    Prostate cancer Neg Hx     Social History Social History   Tobacco Use   Smoking status: Never   Smokeless tobacco: Never  Vaping Use   Vaping status: Never Used  Substance Use Topics   Alcohol use: Not Currently   Drug use: Never     Allergies   Penicillins and Sulfa antibiotics   Review of Systems Review of Systems  Constitutional:  Negative for chills and fever.  HENT:  Negative for ear pain and sore throat.   Eyes:  Negative for pain and visual disturbance.  Respiratory:  Negative for cough and shortness of breath.   Cardiovascular:  Positive for chest pain. Negative for palpitations.  Gastrointestinal:  Negative for abdominal pain and vomiting.  Genitourinary:  Negative for dysuria and hematuria.  Musculoskeletal:  Negative for arthralgias and back pain.  Skin:  Negative for color change and rash.  Neurological:  Negative for seizures and syncope.  All other systems reviewed and are negative.    Physical Exam Triage Vital Signs ED Triage Vitals  Encounter Vitals Group     BP 10/13/23 1657 122/65     Systolic BP Percentile --      Diastolic BP Percentile --      Pulse Rate 10/13/23 1657 70     Resp 10/13/23 1657 20     Temp 10/13/23 1657 98 F (36.7 C)     Temp Source 10/13/23 1657 Oral     SpO2 10/13/23 1657 98 %     Weight --      Height --      Head Circumference --      Peak Flow --      Pain Score 10/13/23 1658 10     Pain Loc --      Pain Education --      Exclude from Growth Chart --    No data found.  Updated Vital Signs BP 122/65 (BP Location: Left Arm)   Pulse 70   Temp 98 F (36.7 C) (Oral)   Resp 20   LMP 08/24/2021 (Exact Date)   SpO2 98%   Visual Acuity Right  Eye Distance:   Left Eye Distance:   Bilateral Distance:  Right Eye Near:   Left Eye Near:    Bilateral Near:     Physical Exam Vitals and nursing note reviewed.  Constitutional:      General: She is not in acute distress.    Appearance: She is well-developed.  HENT:     Head: Normocephalic and atraumatic.  Eyes:     Conjunctiva/sclera: Conjunctivae normal.  Cardiovascular:     Rate and Rhythm: Normal rate and regular rhythm.     Heart sounds: No murmur heard. Pulmonary:     Effort: Pulmonary effort is normal. No respiratory distress.     Breath sounds: Normal breath sounds.  Chest:     Comments: Tenderness to palpitation along the pectoral muscle anteriorly and posteriorly along the trapezius muscle, elevation of the arm improved symptoms. Abdominal:     Palpations: Abdomen is soft.     Tenderness: There is no abdominal tenderness.  Musculoskeletal:        General: No swelling.     Cervical back: Neck supple.  Skin:    General: Skin is warm and dry.     Capillary Refill: Capillary refill takes less than 2 seconds.  Neurological:     Mental Status: She is alert.  Psychiatric:        Mood and Affect: Mood normal.      UC Treatments / Results  Labs (all labs ordered are listed, but only abnormal results are displayed) Labs Reviewed - No data to display  EKG   Radiology No results found.  Procedures Procedures (including critical care time)  Medications Ordered in UC Medications  ketorolac  (TORADOL ) 30 MG/ML injection 30 mg (has no administration in time range)  dexamethasone  (DECADRON ) injection 10 mg (has no administration in time range)    Initial Impression / Assessment and Plan / UC Course  I have reviewed the triage vital signs and the nursing notes.  Pertinent labs & imaging results that were available during my care of the patient were reviewed by me and considered in my medical decision making (see chart for details).     Chest wall  pain  Muscle strain of chest wall, initial encounter   Symptoms are more consistent with a musculoskeletal type pain given that the symptoms are reproducible with palpitation and improve or worsen with certain positions.  The EKG done today is stable when compared to the EKG done at primary care in November.  We will treat with the following and advise return to the emergency room if symptoms worsen: Decadron  injection given today. This is a steroid to help with inflammation and pain. Toradol  injection given today. This is a medication to help with pain. This is not a narcotic.  Prednisone  40 mg daily for 5 days. Take this in the morning. Zanaflex  4 mg every 6 hours as needed for muscle spasms. Use caution as this medication can cause drowsiness.  If chest pain worsens, then recommend going to the ER for further evaluation.     Final Clinical Impressions(s) / UC Diagnoses   Final diagnoses:  Chest wall pain  Muscle strain of chest wall, initial encounter     Discharge Instructions      Symptoms are more consistent with a musculoskeletal type pain given that the symptoms are reproducible with palpitation and improve or worsen with certain positions.  The EKG done today is stable when compared to the EKG done at primary care in November.  We will treat with the following and advise return to the emergency  room if symptoms worsen: Decadron  injection given today. This is a steroid to help with inflammation and pain. Toradol  injection given today. This is a medication to help with pain. This is not a narcotic.  Prednisone  40 mg daily for 5 days. Take this in the morning. Zanaflex  4 mg every 6 hours as needed for muscle spasms. Use caution as this medication can cause drowsiness.  If chest pain worsens, then recommend going to the ER for further evaluation.      ED Prescriptions     Medication Sig Dispense Auth. Provider   predniSONE  (DELTASONE ) 10 MG tablet Take 4 tablets (40 mg total) by  mouth daily with breakfast for 5 days. Start 10/14/23 20 tablet Emmet Messer A, PA-C   tiZANidine  (ZANAFLEX ) 4 MG tablet Take 1 tablet (4 mg total) by mouth every 6 (six) hours as needed for muscle spasms. 30 tablet Teresa Almarie LABOR, [place]      PDMP not reviewed this encounter.   Teresa Almarie LABOR, PA-C 10/13/23 1820

## 2023-10-14 LAB — COLOGUARD: COLOGUARD: NEGATIVE

## 2023-11-28 ENCOUNTER — Encounter: Payer: Self-pay | Admitting: Obstetrics and Gynecology

## 2023-11-28 ENCOUNTER — Encounter: Payer: Self-pay | Admitting: Internal Medicine

## 2023-12-18 ENCOUNTER — Encounter: Payer: Self-pay | Admitting: Internal Medicine

## 2023-12-18 ENCOUNTER — Ambulatory Visit (INDEPENDENT_AMBULATORY_CARE_PROVIDER_SITE_OTHER): Payer: No Typology Code available for payment source | Admitting: Internal Medicine

## 2023-12-18 VITALS — BP 128/80 | HR 75 | Temp 98.5°F | Ht 63.0 in | Wt 254.4 lb

## 2023-12-18 DIAGNOSIS — E039 Hypothyroidism, unspecified: Secondary | ICD-10-CM | POA: Diagnosis not present

## 2023-12-18 DIAGNOSIS — R0789 Other chest pain: Secondary | ICD-10-CM

## 2023-12-18 DIAGNOSIS — E66813 Obesity, class 3: Secondary | ICD-10-CM

## 2023-12-18 DIAGNOSIS — M542 Cervicalgia: Secondary | ICD-10-CM | POA: Diagnosis not present

## 2023-12-18 DIAGNOSIS — I10 Essential (primary) hypertension: Secondary | ICD-10-CM

## 2023-12-18 DIAGNOSIS — Z6841 Body Mass Index (BMI) 40.0 and over, adult: Secondary | ICD-10-CM

## 2023-12-18 DIAGNOSIS — R7303 Prediabetes: Secondary | ICD-10-CM

## 2023-12-18 NOTE — Progress Notes (Signed)
 cI,Victoria T Hamilton, CMA,acting as a Neurosurgeon for Gwynneth Aliment, MD.,have documented all relevant documentation on the behalf of Gwynneth Aliment, MD,as directed by  Gwynneth Aliment, MD while in the presence of Gwynneth Aliment, MD.  Subjective:  Patient ID: Alyssa Oliver , female    DOB: 11-Nov-1975 , 48 y.o.   MRN: [id number]  Chief Complaint  Patient presents with   Hypertension   Prediabetes   Hypothyroidism    HPI  Patient presents today for bp, prediabetes & thyroid follow up. She reports compliance with medications. Denies having any headaches, SOB, and blurred vision.  She completed Cologuard in January.   Hypertension This is a chronic problem. The current episode started more than 1 year ago. The problem has been gradually improving since onset. Associated symptoms include chest pain. Risk factors for coronary artery disease include obesity and sedentary lifestyle. Past treatments include diuretics. The current treatment provides moderate improvement.     Past Medical History:  Diagnosis Date   Acute appendicitis 03/17/2019   Adnexal mass    Diabetes mellitus without complication (HCC)    Fibroid uterus    Headache    Hypertension    Thyroid disease      Family History  Problem Relation Age of Onset   Colon cancer Neg Hx    Breast cancer Neg Hx    Ovarian cancer Neg Hx    Endometrial cancer Neg Hx    Pancreatic cancer Neg Hx    Prostate cancer Neg Hx      Current Outpatient Medications:    baclofen (LIORESAL) 10 MG tablet, Take 1 tablet (10 mg total) by mouth 3 (three) times daily., Disp: 30 tablet, Rfl: 2   FERROCITE 324 MG TABS tablet, Take 1 tablet by mouth daily., Disp: , Rfl:    hydrochlorothiazide (HYDRODIURIL) 25 MG tablet, TAKE 1 TABLET (25 MG TOTAL) BY MOUTH DAILY., Disp: 90 tablet, Rfl: 1   ibuprofen (ADVIL) 600 MG tablet, TAKE 1 TABLET BY MOUTH EVERY 6 HOURS AS NEEDED, Disp: 30 tablet, Rfl: 0   SYNTHROID 75 MCG tablet, Take 1 tablet (75 mcg total) by  mouth daily., Disp: 30 tablet, Rfl: 2   Allergies  Allergen Reactions   Penicillins Itching   Sulfa Antibiotics Itching     Review of Systems  Constitutional: Negative.   Respiratory: Negative.    Cardiovascular:  Positive for chest pain.       She c/o nagging pain above her left breast that radiates toward the top of back on the left side. She went to ED a couple months ago for this same issue. She was given a muscle relaxer. She states this does help sometimes. The discomfort still comes back.   Gastrointestinal: Negative.   Neurological: Negative.   Psychiatric/Behavioral: Negative.       Today's Vitals   12/18/23 1445  BP: 128/80  Pulse: 75  Temp: 98.5 F (36.9 C)  SpO2: 98%  Weight: 254 lb 6.4 oz (115.4 kg)  Height: 5\' 3"  (1.6 m)   Body mass index is 45.06 kg/m.  Wt Readings from Last 3 Encounters:  12/25/23 249 lb (112.9 kg)  12/18/23 254 lb 6.4 oz (115.4 kg)  08/13/23 256 lb 3.2 oz (116.2 kg)     Objective:  Physical Exam Vitals and nursing note reviewed.  Constitutional:      Appearance: Normal appearance. She is obese.  HENT:     Head: Normocephalic and atraumatic.  Eyes:     Extraocular  Movements: Extraocular movements intact.  Cardiovascular:     Rate and Rhythm: Normal rate and regular rhythm.     Heart sounds: Normal heart sounds.  Pulmonary:     Effort: Pulmonary effort is normal.     Breath sounds: Normal breath sounds.  Chest:     Chest wall: Tenderness present.     Comments: Chest wall tenderness Skin:    General: Skin is warm.  Neurological:     General: No focal deficit present.     Mental Status: She is alert.  Psychiatric:        Mood and Affect: Mood normal.        Behavior: Behavior normal.         Assessment And Plan:  Essential hypertension, benign Assessment & Plan: Chronic, fair control. She will continue with hydrochlorothiazide 25mg  daily. Encouraged to follow low sodium diet. She will f/u in six months for  re-evaluation.    Prediabetes Assessment & Plan: Previous labs reviewed, her A1c has been elevated in the past. I will check an A1c today. Reminded to avoid refined sugars including sugary drinks/foods and processed meats including bacon, sausages and deli meats.    Orders: -     Hemoglobin A1c -     BMP8+eGFR  Primary hypothyroidism Assessment & Plan: Chronic, she will continue with Synthroid daily for now. I will check thyroid panel and adjust meds as needed.   Orders: -     TSH  Chest wall pain Assessment & Plan: She has reproducible pain with palpation of affected area. She is encouraged to perform stretching exercises daily, currently works in pharmacy. She should take muscle relaxers prn.   Orders: -     CK  Cervicalgia Assessment & Plan: Chronic, intermittent in nature. She is encouraged to return [facility] for further evaluation/manipulation.    Class 3 severe obesity due to excess calories with serious comorbidity and body mass index (BMI) of 40.0 to 44.9 in adult [facility]) Assessment & Plan: BMI 42. She is encouraged to strive to lose ten percent of her body weight in order to decrease cardiac risk. Encouraged to aim for at least 150 minutes of exercise per week.   Orders: -     Amb Ref to Medical Weight Management  She is encouraged to strive for BMI less than 30 to decrease cardiac risk. Advised to aim for at least 150 minutes of exercise per week.    Return for 4 month pre dm f/u .  Patient was given opportunity to ask questions. Patient verbalized understanding of the plan and was able to repeat key elements of the plan. All questions were answered to their satisfaction.    I, Gwynneth Aliment, MD, have reviewed all documentation for this visit. The documentation on 12/18/23 for the exam, diagnosis, procedures, and orders are all accurate and complete.   IF YOU HAVE BEEN REFERRED TO A SPECIALIST, IT MAY TAKE 1-2 WEEKS TO SCHEDULE/PROCESS THE REFERRAL. IF  YOU HAVE NOT HEARD FROM US/SPECIALIST IN TWO WEEKS, PLEASE GIVE [place] A CALL AT [phone].   THE PATIENT IS ENCOURAGED TO PRACTICE SOCIAL DISTANCING DUE TO THE COVID-19 PANDEMIC.

## 2023-12-18 NOTE — Patient Instructions (Addendum)
 FMLA paperwork  Magnesium glycinate - one nightly Magnesium spray nightly  Hypertension, Adult Hypertension is another name for high blood pressure. High blood pressure forces your heart to work harder to pump blood. This can cause problems over time. There are two numbers in a blood pressure reading. There is a top number (systolic) over a bottom number (diastolic). It is best to have a blood pressure that is below 120/80. What are the causes? The cause of this condition is not known. Some other conditions can lead to high blood pressure. What increases the risk? Some lifestyle factors can make you more likely to develop high blood pressure: Smoking. Not getting enough exercise or physical activity. Being overweight. Having too much fat, sugar, calories, or salt (sodium) in your diet. Drinking too much alcohol. Other risk factors include: Having any of these conditions: Heart disease. Diabetes. High cholesterol. Kidney disease. Obstructive sleep apnea. Having a family history of high blood pressure and high cholesterol. Age. The risk increases with age. Stress. What are the signs or symptoms? High blood pressure may not cause symptoms. Very high blood pressure (hypertensive crisis) may cause: Headache. Fast or uneven heartbeats (palpitations). Shortness of breath. Nosebleed. Vomiting or feeling like you may vomit (nauseous). Changes in how you see. Very bad chest pain. Feeling dizzy. Seizures. How is this treated? This condition is treated by making healthy lifestyle changes, such as: Eating healthy foods. Exercising more. Drinking less alcohol. Your doctor may prescribe medicine if lifestyle changes do not help enough and if: Your top number is above 130. Your bottom number is above 80. Your personal target blood pressure may vary. Follow these instructions at home: Eating and drinking  If told, follow the DASH eating plan. To follow this plan: Fill one half of  your plate at each meal with fruits and vegetables. Fill one fourth of your plate at each meal with whole grains. Whole grains include whole-wheat pasta, brown rice, and whole-grain bread. Eat or drink low-fat dairy products, such as skim milk or low-fat yogurt. Fill one fourth of your plate at each meal with low-fat (lean) proteins. Low-fat proteins include fish, chicken without skin, eggs, beans, and tofu. Avoid fatty meat, cured and processed meat, or chicken with skin. Avoid pre-made or processed food. Limit the amount of salt in your diet to less than 1,500 mg each day. Do not drink alcohol if: Your doctor tells you not to drink. You are pregnant, may be pregnant, or are planning to become pregnant. If you drink alcohol: Limit how much you have to: 0-1 drink a day for women. 0-2 drinks a day for men. Know how much alcohol is in your drink. In the [place] one drink equals one 12 oz bottle of beer (355 mL), one 5 oz glass of wine (148 mL), or one 1 oz glass of hard liquor (44 mL). Lifestyle  Work with your doctor to stay at a healthy weight or to lose weight. Ask your doctor what the best weight is for you. Get at least 30 minutes of exercise that causes your heart to beat faster (aerobic exercise) most days of the week. This may include walking, swimming, or biking. Get at least 30 minutes of exercise that strengthens your muscles (resistance exercise) at least 3 days a week. This may include lifting weights or doing Pilates. Do not smoke or use any products that contain nicotine or tobacco. If you need help quitting, ask your doctor. Check your blood pressure at home as told by  your doctor. Keep all follow-up visits. Medicines Take over-the-counter and prescription medicines only as told by your doctor. Follow directions carefully. Do not skip doses of blood pressure medicine. The medicine does not work as well if you skip doses. Skipping doses also puts you at risk for problems. Ask  your doctor about side effects or reactions to medicines that you should watch for. Contact a doctor if: You think you are having a reaction to the medicine you are taking. You have headaches that keep coming back. You feel dizzy. You have swelling in your ankles. You have trouble with your vision. Get help right away if: You get a very bad headache. You start to feel mixed up (confused). You feel weak or numb. You feel faint. You have very bad pain in your: Chest. Belly (abdomen). You vomit more than once. You have trouble breathing. These symptoms may be an emergency. Get help right away. Call 911. Do not wait to see if the symptoms will go away. Do not drive yourself to the hospital. Summary Hypertension is another name for high blood pressure. High blood pressure forces your heart to work harder to pump blood. For most people, a normal blood pressure is less than 120/80. Making healthy choices can help lower blood pressure. If your blood pressure does not get lower with healthy choices, you may need to take medicine. This information is not intended to replace advice given to you by your health care provider. Make sure you discuss any questions you have with your health care provider. Document Revised: 07/05/2021 Document Reviewed: 07/05/2021 Elsevier Patient Education  2024 [employer].

## 2023-12-19 LAB — BMP8+EGFR
BUN/Creatinine Ratio: 22 (ref 9–23)
BUN: 15 mg/dL (ref 6–24)
CO2: 26 mmol/L (ref 20–29)
Calcium: 10.1 mg/dL (ref 8.7–10.2)
Chloride: 101 mmol/L (ref 96–106)
Creatinine, Ser: 0.67 mg/dL (ref 0.57–1.00)
Glucose: 79 mg/dL (ref 70–99)
Potassium: 3.7 mmol/L (ref 3.5–5.2)
Sodium: 141 mmol/L (ref 134–144)
eGFR: 110 mL/min/{1.73_m2} (ref >59)

## 2023-12-19 LAB — CK: Total CK: 153 U/L (ref 32–182)

## 2023-12-19 LAB — TSH: TSH: 2.66 u[IU]/mL (ref 0.450–4.500)

## 2023-12-19 LAB — HEMOGLOBIN A1C
Est. average glucose Bld gHb Est-mCnc: 137 mg/dL
Hgb A1c MFr Bld: 6.4 % — ABNORMAL HIGH (ref 4.8–5.6)

## 2023-12-24 DIAGNOSIS — Z0289 Encounter for other administrative examinations: Secondary | ICD-10-CM

## 2023-12-25 ENCOUNTER — Encounter (INDEPENDENT_AMBULATORY_CARE_PROVIDER_SITE_OTHER): Payer: Self-pay

## 2023-12-25 ENCOUNTER — Encounter: Payer: Self-pay | Admitting: Nurse Practitioner

## 2023-12-25 ENCOUNTER — Ambulatory Visit (INDEPENDENT_AMBULATORY_CARE_PROVIDER_SITE_OTHER): Admitting: Nurse Practitioner

## 2023-12-25 VITALS — BP 122/75 | HR 71 | Temp 98.3°F | Ht 64.0 in | Wt 249.0 lb

## 2023-12-25 DIAGNOSIS — I10 Essential (primary) hypertension: Secondary | ICD-10-CM

## 2023-12-25 DIAGNOSIS — R7303 Prediabetes: Secondary | ICD-10-CM | POA: Diagnosis not present

## 2023-12-25 DIAGNOSIS — E039 Hypothyroidism, unspecified: Secondary | ICD-10-CM

## 2023-12-25 DIAGNOSIS — E66813 Obesity, class 3: Secondary | ICD-10-CM

## 2023-12-25 DIAGNOSIS — Z6841 Body Mass Index (BMI) 40.0 and over, adult: Secondary | ICD-10-CM

## 2023-12-25 NOTE — Progress Notes (Signed)
 Office: [phone]  /  Fax: [phone]   Initial Visit  Alyssa Oliver was seen in clinic today to evaluate for obesity. She is interested in losing weight to improve overall health and reduce the risk of weight related complications. She presents today to review program treatment options, initial physical assessment, and evaluation.     She was referred by: PCP  When asked what else they would like to accomplish? She states: Adopt healthier eating patterns, Improve energy levels and physical activity, Improve existing medical conditions, Reduce number of medications, Improve quality of life, Improve appearance, and Improve self-confidence  She started gaining weight 13 years ago after having her son and has struggled with her weight since.  She denies being overweight prior to having her son.  When asked how has your weight affected you? She states: Having fatigue and Having poor endurance  Some associated conditions: Hypertension, hypothyroidism, hip pain, anemia, prediabetes, neck pain  Contributing factors: Family history of obesity  Weight promoting medications identified: None  Current nutrition plan: None  Current level of physical activity: None  Current or previous pharmacotherapy: None  Response to medication: Never tried medications   Past medical history includes:   Past Medical History:  Diagnosis Date   Acute appendicitis 03/17/2019   Adnexal mass    Diabetes mellitus without complication (HCC)    Fibroid uterus    Headache    Hypertension    Thyroid disease      Objective:   BP 122/75   Pulse 71   Temp 98.3 F (36.8 C)   Ht 5\' 4"  (1.626 m)   Wt 249 lb (112.9 kg)   LMP 08/24/2021 (Exact Date)   SpO2 100%   BMI 42.74 kg/m  She was weighed on the bioimpedance scale: Body mass index is 42.74 kg/m.  Peak Weight:256 lbs , Body Fat%:49.3%, Visceral Fat Rating:15, Weight trend over the last 12 months: Decreasing  General:  Alert, oriented and  cooperative. Patient is in no acute distress.  Respiratory: Normal respiratory effort, no problems with respiration noted   Gait: able to ambulate independently  Mental Status: Normal mood and affect. Normal behavior. Normal judgment and thought content.   DIAGNOSTIC DATA REVIEWED:  BMET    Component Value Date/Time   NA 141 12/18/2023 1549   K 3.7 12/18/2023 1549   CL 101 12/18/2023 1549   CO2 26 12/18/2023 1549   GLUCOSE 79 12/18/2023 1549   GLUCOSE 105 (H) 06/14/2022 1214   BUN 15 12/18/2023 1549   CREATININE 0.67 12/18/2023 1549   CALCIUM 10.1 12/18/2023 1549   GFRNONAA >60 06/14/2022 1214   GFRAA >60 12/09/2019 2045   Lab Results  Component Value Date   HGBA1C 6.4 (H) 12/18/2023   HGBA1C 5.0 12/09/2019   No results found for: "INSULIN" CBC    Component Value Date/Time   WBC 6.6 08/13/2023 1619   WBC 6.2 06/14/2022 1214   RBC 4.48 08/13/2023 1619   RBC 4.70 06/14/2022 1214   HGB 13.1 08/13/2023 1619   HCT 39.9 08/13/2023 1619   PLT 369 08/13/2023 1619   MCV 89 08/13/2023 1619   MCH 29.2 08/13/2023 1619   MCH 30.0 06/14/2022 1214   MCHC 32.8 08/13/2023 1619   MCHC 33.9 06/14/2022 1214   RDW 13.4 08/13/2023 1619   Iron/TIBC/Ferritin/ %Sat No results found for: "IRON", "TIBC", "FERRITIN", "IRONPCTSAT" Lipid Panel     Component Value Date/Time   CHOL 166 08/13/2023 1619   TRIG 87 08/13/2023 1619  HDL 47 08/13/2023 1619   CHOLHDL 3.5 08/13/2023 1619   LDLCALC 103 (H) 08/13/2023 1619   Hepatic Function Panel     Component Value Date/Time   PROT 7.3 08/13/2023 1619   ALBUMIN 4.6 08/13/2023 1619   AST 23 08/13/2023 1619   ALT 17 08/13/2023 1619   ALKPHOS 108 08/13/2023 1619   BILITOT <0.2 08/13/2023 1619      Component Value Date/Time   TSH 2.660 12/18/2023 1549     Assessment and Plan:   Essential hypertension, benign Continue to follow-up with PCP.  Continue medications as directed.  Primary hypothyroidism Continue follow-up with PCP.   Continue medications as directed.  Prediabetes Last A1c was 6.4 on 12/18/2023.  Will continue to monitor.  Class 3 severe obesity due to excess calories with serious comorbidity and body mass index (BMI) of 40.0 to 44.9 in adult [facility])        Obesity Treatment / Action Plan:  Patient will work on garnering support from family and friends to begin weight loss journey. Will work on eliminating or reducing the presence of highly palatable, calorie dense foods in the home. Will complete provided nutritional and psychosocial assessment questionnaire before the next appointment. Will be scheduled for indirect calorimetry to determine resting energy expenditure in a fasting state.  This will allow [place] to create a reduced calorie, high-protein meal plan to promote loss of fat mass while preserving muscle mass. Counseled on the health benefits of losing 5%-15% of total body weight. Was counseled on nutritional approaches to weight loss and benefits of reducing processed foods and consuming plant-based foods and high quality protein as part of nutritional weight management. Was counseled on pharmacotherapy and role as an adjunct in weight management.   Obesity Education Performed Today:  She was weighed on the bioimpedance scale and results were discussed and documented in the synopsis.  We discussed obesity as a disease and the importance of a more detailed evaluation of all the factors contributing to the disease.  We discussed the importance of long term lifestyle changes which include nutrition, exercise and behavioral modifications as well as the importance of customizing this to her specific health and social needs.  We discussed the benefits of reaching a healthier weight to alleviate the symptoms of existing conditions and reduce the risks of the biomechanical, metabolic and psychological effects of obesity.  Alyssa Oliver appears to be in the action stage of change and states they are ready  to start intensive lifestyle modifications and behavioral modifications.  30 minutes was spent today on this visit including the above counseling, pre-visit chart review, and post-visit documentation.  Reviewed by clinician on day of visit: allergies, medications, problem list, medical history, surgical history, family history, social history, and previous encounter notes pertinent to obesity diagnosis.    Theodis Sato Myalynn Lingle FNP-C

## 2023-12-28 DIAGNOSIS — R0789 Other chest pain: Secondary | ICD-10-CM | POA: Insufficient documentation

## 2023-12-28 NOTE — Assessment & Plan Note (Signed)
 Chronic, fair control. She will continue with hydrochlorothiazide 25mg  daily. Encouraged to follow low sodium diet. She will f/u in six months for re-evaluation.

## 2023-12-28 NOTE — Assessment & Plan Note (Signed)
 Chronic, intermittent in nature. She is encouraged to return to [facility] for further evaluation/manipulation.

## 2023-12-28 NOTE — Assessment & Plan Note (Signed)
 Chronic, she will continue with Synthroid daily for now. I will check thyroid panel and adjust meds as needed.

## 2023-12-28 NOTE — Assessment & Plan Note (Signed)
 Previous labs reviewed, her A1c has been elevated in the past. I will check an A1c today. Reminded to avoid refined sugars including sugary drinks/foods and processed meats including bacon, sausages and deli meats.

## 2023-12-28 NOTE — Assessment & Plan Note (Signed)
 BMI 42. She is encouraged to strive to lose ten percent of her body weight in order to decrease cardiac risk. Encouraged to aim for at least 150 minutes of exercise per week.

## 2023-12-28 NOTE — Assessment & Plan Note (Signed)
 She has reproducible pain with palpation of affected area. She is encouraged to perform stretching exercises daily, currently works in pharmacy. She should take muscle relaxers prn.

## 2024-01-01 ENCOUNTER — Encounter: Payer: Self-pay | Admitting: Internal Medicine

## 2024-01-02 ENCOUNTER — Encounter: Payer: Self-pay | Admitting: Internal Medicine

## 2024-01-06 ENCOUNTER — Ambulatory Visit (INDEPENDENT_AMBULATORY_CARE_PROVIDER_SITE_OTHER): Admitting: Bariatrics

## 2024-01-06 ENCOUNTER — Encounter: Payer: Self-pay | Admitting: Bariatrics

## 2024-01-06 VITALS — BP 124/79 | HR 62 | Temp 97.8°F | Ht 64.0 in | Wt 246.0 lb

## 2024-01-06 DIAGNOSIS — E039 Hypothyroidism, unspecified: Secondary | ICD-10-CM | POA: Diagnosis not present

## 2024-01-06 DIAGNOSIS — R5383 Other fatigue: Secondary | ICD-10-CM

## 2024-01-06 DIAGNOSIS — I10 Essential (primary) hypertension: Secondary | ICD-10-CM

## 2024-01-06 DIAGNOSIS — R0602 Shortness of breath: Secondary | ICD-10-CM | POA: Diagnosis not present

## 2024-01-06 DIAGNOSIS — Z6841 Body Mass Index (BMI) 40.0 and over, adult: Secondary | ICD-10-CM

## 2024-01-06 DIAGNOSIS — E559 Vitamin D deficiency, unspecified: Secondary | ICD-10-CM

## 2024-01-06 DIAGNOSIS — R7303 Prediabetes: Secondary | ICD-10-CM

## 2024-01-06 DIAGNOSIS — Z1331 Encounter for screening for depression: Secondary | ICD-10-CM

## 2024-01-06 DIAGNOSIS — E66813 Obesity, class 3: Secondary | ICD-10-CM

## 2024-01-06 DIAGNOSIS — Z0000 Encounter for general adult medical examination without abnormal findings: Secondary | ICD-10-CM

## 2024-01-06 NOTE — Progress Notes (Signed)
 At a Glance:  Vitals Temp: 97.8 F (36.6 C) BP: 124/79 Pulse Rate: 62 SpO2: 98 %   Anthropometric Measurements Height: 5\' 4"  (1.626 m) Weight: 246 lb (111.6 kg) BMI (Calculated): 42.21 Starting Weight: 246lb Peak Weight: 253lb Waist Measurement : 50 inches   Body Composition  Body Fat %: 49 % Fat Mass (lbs): 120.6 lbs Muscle Mass (lbs): 119.2 lbs Total Body Water (lbs): 88.2 lbs Visceral Fat Rating : 15   Other Clinical Data RMR: 1627 Fasting: yes Labs: yes Today's Visit #: 1 Starting Date: 01/06/24    Indirect Calorimeter:   Resting Metabolic Rate ( RMR):  RMR (actual): 1627 kcal RMR (calculated):  1796 kcal The calculated basal metabolic rate is 6644 kcal  thus her basal metabolic rate is worse than expected.  Plan:   Indirect calorimeter completed, interpreted and reviewed with patient today and allowed to ask questions.  Discussed the implications for the chosen plan and exercise based on the RMR reading.  Will consider repeating the RMR in the future based on weight loss.    Chief Complaint:  Obesity   Subjective:  Alyssa Oliver (MR# [id number]) is a 48 y.o. female who presents for evaluation and treatment of obesity and related comorbidities.   Alyssa Oliver is currently in the action stage of change and ready to dedicate time achieving and maintaining a healthier weight. Alyssa Oliver is interested in becoming our patient and working on intensive lifestyle modifications including (but not limited to) diet and exercise for weight loss.  Alyssa Oliver has been struggling with her weight. She has been unsuccessful in either losing weight, maintaining weight loss, or reaching her healthy weight goal.  Alyssa Oliver's habits were reviewed today and are as follows: Her family eats meals together, she thinks her family will eat healthier with her, she started gaining weight with her pregnancies, and she frequently eats larger portions than normal.  Current or previous  pharmacotherapy: None  Response to medication: Never tried medications  Other Fatigue Alyssa Oliver admits to daytime somnolence and admits to waking up still tired. Patient has a history of symptoms of daytime fatigue. Alyssa Oliver generally gets 5 or 6 hours of sleep per night, and states that she has difficulty falling asleep. Snoring is present. Apneic episodes are not present. Epworth Sleepiness Score is 18.   Shortness of Breath Dorenda notes increasing shortness of breath with exercising and seems to be worsening over time with weight gain. She notes getting out of breath sooner with activity than she used to. This has not gotten worse recently. Alyssa Oliver denies shortness of breath at rest or orthopnea.  Depression Screen Alyssa Oliver Food and Mood (modified PHQ-9) score was 3. <5 no depression     08/13/2023    3:36 PM  Depression screen PHQ 2/9  Decreased Interest 0  Down, Depressed, Hopeless 0  PHQ - 2 Score 0  Altered sleeping 0  Tired, decreased energy 0  Change in appetite 0  Feeling bad or failure about yourself  0  Trouble concentrating 0  Moving slowly or fidgety/restless 0  Suicidal thoughts 0  PHQ-9 Score 0  Difficult doing work/chores Not difficult at all     Assessment and Plan:   Other Fatigue Alyssa Oliver does feel that her weight is causing her energy to be lower than it should be. Fatigue may be related to obesity, depression or many other causes. Labs will be ordered, and in the meanwhile, Alyssa Oliver will focus on self care including making healthy food choices, increasing physical activity and  focusing on stress reduction.  Shortness of Breath Alyssa Oliver does feel that she gets out of breath more easily that she used to when she exercises. Alyssa Oliver's shortness of breath appears to be obesity related and exercise induced. She has agreed to work on weight loss and gradually increase exercise to treat her exercise induced shortness of breath. Will continue to monitor closely.  Health Maintenance:    Obesity   Plan: Will do EKG, indirect calorimetry, and labs.     Vitamin D Deficiency Vitamin D is not at goal of 50.  Most recent vitamin D level was 45.3. She is at risk for vitamin D deficiency due to obesity.  She is not on vitamin D at this time.  Lab Results  Component Value Date   VD25OH 45.3 12/17/2022    Plan: Will check for vitamin D deficiency.   Hypertension Hypertension stable.  Medication(s): Hydrochlorothiazide 25 mg daily   BP Readings from Last 3 Encounters:  01/06/24 124/79  12/25/23 122/75  12/18/23 128/80   Lab Results  Component Value Date   CREATININE 0.67 12/18/2023   CREATININE 0.82 08/13/2023   CREATININE 0.77 12/17/2022   No results found for: "GFR"  Plan: Continue all antihypertensives at current dosages. No added salt. Will keep sodium content to 1,500 mg or less per day.    Hypothyroidism Stable.  Does not report symptoms associated with uncontrolled hypothyroidism. Medication(s): Levothyroxine 75 mcg daily. Lab Results  Component Value Date   TSH 2.660 12/18/2023    Plan: Continue levothyroxine at current dose. Counseling: The correct way to take levothyroxine is fasting, with water, separated by at least 30 minutes from breakfast, and separated by more than 4 hours from calcium, iron, multivitamins, acid reflux medications (PPIs).   Prediabetes Last A1c was 6.4  Medication(s): Metformin 500 mg in the am.   Lab Results  Component Value Date   HGBA1C 6.4 (H) 12/18/2023   HGBA1C 6.4 (H) 08/13/2023   HGBA1C 6.3 (H) 12/17/2022   HGBA1C 6.4 (H) 06/14/2022   HGBA1C 5.9 (H) 09/03/2021   No results found for: "INSULIN"  Plan: Will minimize all refined carbohydrates both sweets and starches.  Will work on the plan and exercise.  Consider both aerobic and resistance training.  Will keep protein, water, and fiber intake high.  Increase Polyunsaturated and Monounsaturated fats to increase satiety and encourage weight loss.   Aim for 7 to 9 hours of sleep nightly.  Metformin 500 mg 1 p.o. daily with breakfast  Previous labs reviewed today. Date: 12/18/2023 HgbA1c, BMP, and CK total.   Labs done today Lipids, Insulin, and Vit D   Morbid Obesity: BMI (Calculated): 42.21   Alyssa Oliver is currently in the action stage of change and her goal is to begin weight loss efforts. I recommend Alyssa Oliver begin the structured treatment plan as follows:  She has agreed to Category 2 Plan, will incorporate some of her native recipes in the future.   Exercise goals: All adults should avoid inactivity. Some activity is better than none, and adults who participate in any amount of physical activity, gain some health benefits.  Behavioral modification strategies:increasing lean protein intake, increasing vegetables, increase H2O intake, increase high fiber foods, no skipping meals, keeping healthy foods in the home, better snacking choices, and avoiding temptations  She was informed of the importance of frequent follow-up visits to maximize her success with intensive lifestyle modifications for her multiple health conditions. She was informed we would discuss her lab results at her next  visit unless there is a critical issue that needs to be addressed sooner. Alyssa Oliver agreed to keep her next visit at the agreed upon time to discuss these results.  Objective:  General: Cooperative, alert, well developed, in no acute distress. HEENT: Conjunctivae and lids unremarkable. Cardiovascular: Regular rhythm.  Lungs: Normal work of breathing. Neurologic: No focal deficits.   Lab Results  Component Value Date   CREATININE 0.67 12/18/2023   BUN 15 12/18/2023   NA 141 12/18/2023   K 3.7 12/18/2023   CL 101 12/18/2023   CO2 26 12/18/2023   Lab Results  Component Value Date   ALT 17 08/13/2023   AST 23 08/13/2023   ALKPHOS 108 08/13/2023   BILITOT <0.2 08/13/2023   Lab Results  Component Value Date   HGBA1C 6.4 (H) 12/18/2023   HGBA1C  6.4 (H) 08/13/2023   HGBA1C 6.3 (H) 12/17/2022   HGBA1C 6.4 (H) 06/14/2022   HGBA1C 5.9 (H) 09/03/2021   No results found for: "INSULIN" Lab Results  Component Value Date   TSH 2.660 12/18/2023   Lab Results  Component Value Date   CHOL 166 08/13/2023   HDL 47 08/13/2023   LDLCALC 103 (H) 08/13/2023   TRIG 87 08/13/2023   CHOLHDL 3.5 08/13/2023   Lab Results  Component Value Date   WBC 6.6 08/13/2023   HGB 13.1 08/13/2023   HCT 39.9 08/13/2023   MCV 89 08/13/2023   PLT 369 08/13/2023   No results found for: "IRON", "TIBC", "FERRITIN"  Attestation Statements:  Applicable history such as the following:  allergies, medications, problem list, medical history, surgical history, family history, social history, and previous encounter notes reviewed by clinician on day of visit:  Time spent on visit in care of the patient today including the items listed below was 60 minutes.  I reviewed the labs which were ordered from her visit on 12/18/23  20 minutes were spent talking about the history 20 minutes for face to face counseling implementing the plan, discussing the specifics of how to arrange meals, meal planning, water intake.   I spent face to face time discussing his/her plan, including breakfast, additional breakfast options, lunch, and dinner options, grocery list, and snacks.  I reviewed her indirect calorimetry. I discussed the implications for the diet plan.    Discussed the bio-impedence test (fat %, muscle mass, and water weight) and allowed the patient to ask questions.   Discussed the following information sheets (Category 2 and snacks, and Eating Out).  I additionally spent time documenting, reviewing, and checking the codes before submitting.   This may have been prepared with the assistance of Engineer, civil (consulting).  Occasional wrong-word or sound-a-like substitutions may have occurred due to the inherent limitations of voice recognition software.    Corinna Capra, DO

## 2024-01-07 LAB — LIPID PANEL WITH LDL/HDL RATIO
Cholesterol, Total: 176 mg/dL (ref 100–199)
HDL: 42 mg/dL (ref >39)
LDL Chol Calc (NIH): 120 mg/dL — ABNORMAL HIGH (ref 0–99)
LDL/HDL Ratio: 2.9 ratio (ref 0.0–3.2)
Triglycerides: 77 mg/dL (ref 0–149)
VLDL Cholesterol Cal: 14 mg/dL (ref 5–40)

## 2024-01-07 LAB — INSULIN, RANDOM: INSULIN: 16.4 u[IU]/mL (ref 2.6–24.9)

## 2024-01-07 LAB — VITAMIN D 25 HYDROXY (VIT D DEFICIENCY, FRACTURES): Vit D, 25-Hydroxy: 32.5 ng/mL (ref 30.0–100.0)

## 2024-01-08 ENCOUNTER — Ambulatory Visit: Admitting: Bariatrics

## 2024-01-19 ENCOUNTER — Ambulatory Visit: Admitting: Bariatrics

## 2024-01-20 ENCOUNTER — Other Ambulatory Visit: Payer: Self-pay | Admitting: Internal Medicine

## 2024-01-20 ENCOUNTER — Ambulatory Visit: Admitting: Bariatrics

## 2024-01-20 ENCOUNTER — Ambulatory Visit (INDEPENDENT_AMBULATORY_CARE_PROVIDER_SITE_OTHER): Admitting: Bariatrics

## 2024-01-20 ENCOUNTER — Encounter: Payer: Self-pay | Admitting: Bariatrics

## 2024-01-20 ENCOUNTER — Other Ambulatory Visit: Payer: Self-pay | Admitting: Nurse Practitioner

## 2024-01-20 VITALS — BP 129/80 | HR 57 | Temp 97.8°F | Ht 64.0 in | Wt 245.0 lb

## 2024-01-20 DIAGNOSIS — E559 Vitamin D deficiency, unspecified: Secondary | ICD-10-CM

## 2024-01-20 DIAGNOSIS — Z6841 Body Mass Index (BMI) 40.0 and over, adult: Secondary | ICD-10-CM

## 2024-01-20 DIAGNOSIS — E88819 Insulin resistance, unspecified: Secondary | ICD-10-CM

## 2024-01-20 MED ORDER — VITAMIN D (ERGOCALCIFEROL) 1.25 MG (50000 UNIT) PO CAPS: 50000.0000 [IU] | ORAL_CAPSULE | ORAL | 0 refills | Status: DC

## 2024-01-20 MED ORDER — IBUPROFEN 600 MG PO TABS: 600.0000 mg | ORAL_TABLET | Freq: Four times a day (QID) | ORAL | 0 refills | Status: AC | PRN

## 2024-01-20 MED ORDER — METFORMIN HCL ER (OSM) 500 MG PO TB24: 500.0000 mg | ORAL_TABLET | Freq: Every day | ORAL | 0 refills | Status: DC

## 2024-01-20 NOTE — Progress Notes (Signed)
 First follow-up after initial visit.        WEIGHT SUMMARY AND BIOMETRICS  Weight Lost Since Last Visit: 1lb  Weight Gained Since Last Visit: 0   Vitals Temp: 97.8 F (36.6 C) BP: 129/80 Pulse Rate: (!) 57 SpO2: 99 %   Anthropometric Measurements Height: 5\' 4"  (1.626 m) Weight: 245 lb (111.1 kg) BMI (Calculated): 42.03 Weight at Last Visit: 246lb Weight Lost Since Last Visit: 1lb Weight Gained Since Last Visit: 0 Starting Weight: 246lb Total Weight Loss (lbs): 1 lb (0.454 kg) Peak Weight: 253lb   Body Composition  Body Fat %: 49.2 % Fat Mass (lbs): 121 lbs Muscle Mass (lbs): 118.4 lbs Total Body Water  (lbs): 89.8 lbs Visceral Fat Rating : 15   Other Clinical Data Fasting: no Labs: no Today's Visit #: 2 Starting Date: 01/06/24    OBESITY Alyssa Oliver is here to discuss her progress with her obesity treatment plan along with follow-up of her obesity related diagnoses.    Nutrition Plan: the Category 2 plan - 80% adherence.  Current exercise:  Home exercises.  Interim History:  She is down 1 lb since her last visit.  Not eating all of the food on the plan., Protein intake is less than prescribed., Is not skipping meals, Water  intake is inadequate., and Reports polyphagia  Initial positives regarding the dietary plan: She needs options for rice.  Initial challenges regarding  the dietary plan: She has been hungry at time.   Pharmacotherapy: Alyssa Oliver is on Metformin  500 mg once daily breakfast Adverse side effects: None Hunger is moderately controlled.  Cravings are moderately controlled.  Assessment/Plan:    Insulin  Resistance Alyssa Oliver has had elevated fasting insulin  readings. Goal is HgbA1c < 5.7, fasting insulin  at l0 or less, and preferably at 5.  She reports polyphagia, but not severe.  Medication(s): She has been on metformin  in the past  but had stopped but wants to resume.   Lab Results  Component Value Date   INSULIN  16.4 01/06/2024    Plan Medication(s): Metformin  500 mg once daily breakfast Will work on the agreed upon plan. Will minimize refined carbohydrates ( sweets and starches), and focus more on complex carbohydrates.  Increase the micronutrients found in leafy greens, which include magnesium, polyphenols, and vitamin C which have been postulated to help with insulin  sensitivity. Minimize "fast food" and cook more meals at home.  Increase fiber to 25 to 30 grams daily.  Discussed high-protein sources and information sheet given. Discussed alternate high-protein meals. She will start to journal using one of the popular apps. She will weigh her protein on a regular basis.  Vitamin D  Deficiency Vitamin D  is not at goal of 50.  Most recent vitamin D  level was 32.5. She is not taking any vitamin D   Lab Results  Component Value Date   VD25OH 32.5 01/06/2024   VD25OH 45.3 12/17/2022    Plan: Start  prescription vitamin D  50,000 IU weekly.     Morbid Obesity: Current BMI BMI (Calculated): 42.03   Pharmacotherapy Plan Continue and refill  Metformin  500 mg once daily breakfast  Alyssa Oliver is currently in the action stage of change. As such, her goal is to continue with weight loss efforts.  She has agreed to the Category 2 plan.  Exercise goals: All adults should avoid inactivity. Some physical activity is better than none, and adults who participate in any amount of physical activity gain some health benefits.  Behavioral modification strategies: increasing lean protein intake, no meal skipping, decrease  eating out, increase water  intake, better snacking choices, planning for success, increasing vegetables, decrease snacking , avoiding temptations, keep healthy foods in the home, and increase frequency of journaling.  Alyssa Oliver has agreed to follow-up with our clinic in 2 weeks.   Labs reviewed today from last  visit (Lipids,  insulin , vitamin D ).   Objective:   VITALS: Per patient if applicable, see vitals. GENERAL: Alert and in no acute distress. CARDIOPULMONARY: No increased WOB. Speaking in clear sentences.  PSYCH: Pleasant and cooperative. Speech normal rate and rhythm. Affect is appropriate. Insight and judgement are appropriate. Attention is focused, linear, and appropriate.  NEURO: Oriented as arrived to appointment on time with no prompting.   Attestation Statements:   This was prepared with the assistance of Engineer, civil (consulting).  Occasional wrong-word or sound-a-like substitutions may have occurred due to the inherent limitations of voice recognition software.   Kirk Peper, DO

## 2024-01-26 ENCOUNTER — Ambulatory Visit: Admitting: Bariatrics

## 2024-01-27 ENCOUNTER — Encounter: Payer: Self-pay | Admitting: Internal Medicine

## 2024-02-02 ENCOUNTER — Ambulatory Visit (INDEPENDENT_AMBULATORY_CARE_PROVIDER_SITE_OTHER): Admitting: Family Medicine

## 2024-02-03 ENCOUNTER — Ambulatory Visit (INDEPENDENT_AMBULATORY_CARE_PROVIDER_SITE_OTHER): Admitting: Bariatrics

## 2024-02-03 ENCOUNTER — Encounter: Payer: Self-pay | Admitting: Bariatrics

## 2024-02-03 VITALS — BP 132/85 | HR 60 | Temp 97.7°F | Ht 64.0 in | Wt 245.0 lb

## 2024-02-03 DIAGNOSIS — Z6841 Body Mass Index (BMI) 40.0 and over, adult: Secondary | ICD-10-CM

## 2024-02-03 DIAGNOSIS — R7303 Prediabetes: Secondary | ICD-10-CM

## 2024-02-03 DIAGNOSIS — E559 Vitamin D deficiency, unspecified: Secondary | ICD-10-CM

## 2024-02-03 MED ORDER — VITAMIN D (ERGOCALCIFEROL) 1.25 MG (50000 UNIT) PO CAPS: 50000.0000 [IU] | ORAL_CAPSULE | ORAL | 0 refills | Status: AC

## 2024-02-03 MED ORDER — METFORMIN HCL ER (OSM) 500 MG PO TB24: 500.0000 mg | ORAL_TABLET | Freq: Every day | ORAL | 0 refills | Status: AC

## 2024-02-03 NOTE — Progress Notes (Signed)
 WEIGHT SUMMARY AND BIOMETRICS  Weight Lost Since Last Visit: 0  Weight Gained Since Last Visit: 0   Vitals Temp: 97.7 F (36.5 C) BP: 132/85 Pulse Rate: 60 SpO2: 100 %   Anthropometric Measurements Height: 5\' 4"  (1.626 m) Weight: 245 lb (111.1 kg) BMI (Calculated): 42.03 Weight at Last Visit: 245lb Weight Lost Since Last Visit: 0 Weight Gained Since Last Visit: 0 Starting Weight: 246lb Total Weight Loss (lbs): 1 lb (0.454 kg) Peak Weight: 253lb   Body Composition  Body Fat %: 49.5 % Fat Mass (lbs): 121.6 lbs Muscle Mass (lbs): 117.8 lbs Total Body Water  (lbs): 92 lbs Visceral Fat Rating : 15   Other Clinical Data Fasting: no Labs: no Today's Visit #: 3 Starting Date: 01/06/24    OBESITY Alyzea is here to discuss her progress with her obesity treatment plan along with follow-up of her obesity related diagnoses.    Nutrition Plan: the Category 2 plan - 50% adherence.  Current exercise: walking  Interim History:  Her weight remains the same. She started walking yesterday. She has cut her carbohydrates.  Eating all of the food on the plan., Is not skipping meals, Not journaling consistently., and Water  intake is adequate.   Pharmacotherapy: Dicie is on Metformin  500 mg by mouth daily  Adverse side effects: None Hunger is moderately controlled.  Cravings are moderately controlled.  Assessment/Plan:   Vitamin D  Deficiency Vitamin D  is not at goal of 50.  Most recent vitamin D  level was 32.5. She is on  prescription ergocalciferol  50,000 IU weekly. Lab Results  Component Value Date   VD25OH 32.5 01/06/2024   VD25OH 45.3 12/17/2022    Plan: Refill prescription vitamin D  50,000 IU weekly.  She will get some sun exposure.  Prediabetes Last A1c was 6.4  Medication(s): Metformin    Lab Results  Component Value Date   HGBA1C 6.4 (H)  12/18/2023   HGBA1C 6.4 (H) 08/13/2023   HGBA1C 6.3 (H) 12/17/2022   HGBA1C 6.4 (H) 06/14/2022   HGBA1C 5.9 (H) 09/03/2021   Lab Results  Component Value Date   INSULIN  16.4 01/06/2024    Plan: Will minimize all refined carbohydrates both sweets and starches.  Will work on the plan and exercise.  Consider both aerobic and resistance training.  Will keep protein, water , and fiber intake high.  Increase Polyunsaturated and Monounsaturated fats to increase satiety and encourage weight loss.  She will replace 1 meal with a shake that has at least 20 to 30 g of protein.  She will journal more frequently and will use her journaling numbers guide that was given to her today.  She is considering using some factor meals and we discussed the appropriate amount of calories and protein for specific meals.  Continue and refill Metformin  500 mg once daily breakfast    Morbid Obesity: Current BMI BMI (Calculated): 42.03  Pharmacotherapy Plan Continue and refill  Metformin  500 mg once daily breakfast #30 with no refills.  Kinsli is currently in the action stage of change. As such, her goal is to continue with weight loss efforts.  She has agreed to the Category 2 plan and keeping a food journal with goal of 1,200 calories and 80 to 90 grams of protein daily.  Exercise goals: All adults should avoid inactivity. Some physical activity is better than none, and adults who participate in any amount of physical activity gain some health benefits. She has started to walk on a regular basis.   Behavioral modification strategies: increasing lean protein intake, no meal skipping, decrease eating out, meal planning , increase water  intake, better snacking choices, planning for success, avoiding temptations, keep healthy foods in the home, increase frequency of journaling, and mindful eating.  Morley has agreed to follow-up with our clinic in 2 weeks.    Objective:   VITALS: Per patient if applicable, see  vitals. GENERAL: Alert and in no acute distress. CARDIOPULMONARY: No increased WOB. Speaking in clear sentences.  PSYCH: Pleasant and cooperative. Speech normal rate and rhythm. Affect is appropriate. Insight and judgement are appropriate. Attention is focused, linear, and appropriate.  NEURO: Oriented as arrived to appointment on time with no prompting.   Attestation Statements:  This was prepared with the assistance of Engineer, civil (consulting).  Occasional wrong-word or sound-a-like substitutions may have occurred due to the inherent limitations of voice recognition   Kirk Peper, DO

## 2024-02-04 ENCOUNTER — Encounter: Payer: Self-pay | Admitting: Internal Medicine

## 2024-02-04 ENCOUNTER — Ambulatory Visit (INDEPENDENT_AMBULATORY_CARE_PROVIDER_SITE_OTHER): Payer: Self-pay | Admitting: Internal Medicine

## 2024-02-04 VITALS — BP 124/82 | HR 78 | Temp 98.5°F | Ht 64.0 in | Wt 251.2 lb

## 2024-02-04 DIAGNOSIS — Z6841 Body Mass Index (BMI) 40.0 and over, adult: Secondary | ICD-10-CM | POA: Diagnosis not present

## 2024-02-04 DIAGNOSIS — E66813 Obesity, class 3: Secondary | ICD-10-CM | POA: Diagnosis not present

## 2024-02-04 DIAGNOSIS — Z566 Other physical and mental strain related to work: Secondary | ICD-10-CM | POA: Diagnosis not present

## 2024-02-04 DIAGNOSIS — R7303 Prediabetes: Secondary | ICD-10-CM

## 2024-02-04 DIAGNOSIS — I10 Essential (primary) hypertension: Secondary | ICD-10-CM

## 2024-02-04 NOTE — Patient Instructions (Signed)
 Hypertension, Adult Hypertension is another name for high blood pressure. High blood pressure forces your heart to work harder to pump blood. This can cause problems over time. There are two numbers in a blood pressure reading. There is a top number (systolic) over a bottom number (diastolic). It is best to have a blood pressure that is below 120/80. What are the causes? The cause of this condition is not known. Some other conditions can lead to high blood pressure. What increases the risk? Some lifestyle factors can make you more likely to develop high blood pressure: Smoking. Not getting enough exercise or physical activity. Being overweight. Having too much fat, sugar, calories, or salt (sodium) in your diet. Drinking too much alcohol. Other risk factors include: Having any of these conditions: Heart disease. Diabetes. High cholesterol. Kidney disease. Obstructive sleep apnea. Having a family history of high blood pressure and high cholesterol. Age. The risk increases with age. Stress. What are the signs or symptoms? High blood pressure may not cause symptoms. Very high blood pressure (hypertensive crisis) may cause: Headache. Fast or uneven heartbeats (palpitations). Shortness of breath. Nosebleed. Vomiting or feeling like you may vomit (nauseous). Changes in how you see. Very bad chest pain. Feeling dizzy. Seizures. How is this treated? This condition is treated by making healthy lifestyle changes, such as: Eating healthy foods. Exercising more. Drinking less alcohol. Your doctor may prescribe medicine if lifestyle changes do not help enough and if: Your top number is above 130. Your bottom number is above 80. Your personal target blood pressure may vary. Follow these instructions at home: Eating and drinking  If told, follow the DASH eating plan. To follow this plan: Fill one half of your plate at each meal with fruits and vegetables. Fill one fourth of your plate  at each meal with whole grains. Whole grains include whole-wheat pasta, brown rice, and whole-grain bread. Eat or drink low-fat dairy products, such as skim milk or low-fat yogurt. Fill one fourth of your plate at each meal with low-fat (lean) proteins. Low-fat proteins include fish, chicken without skin, eggs, beans, and tofu. Avoid fatty meat, cured and processed meat, or chicken with skin. Avoid pre-made or processed food. Limit the amount of salt in your diet to less than 1,500 mg each day. Do not drink alcohol if: Your doctor tells you not to drink. You are pregnant, may be pregnant, or are planning to become pregnant. If you drink alcohol: Limit how much you have to: 0-1 drink a day for women. 0-2 drinks a day for men. Know how much alcohol is in your drink. In the [place] one drink equals one 12 oz bottle of beer (355 mL), one 5 oz glass of wine (148 mL), or one 1 oz glass of hard liquor (44 mL). Lifestyle  Work with your doctor to stay at a healthy weight or to lose weight. Ask your doctor what the best weight is for you. Get at least 30 minutes of exercise that causes your heart to beat faster (aerobic exercise) most days of the week. This may include walking, swimming, or biking. Get at least 30 minutes of exercise that strengthens your muscles (resistance exercise) at least 3 days a week. This may include lifting weights or doing Pilates. Do not smoke or use any products that contain nicotine or tobacco. If you need help quitting, ask your doctor. Check your blood pressure at home as told by your doctor. Keep all follow-up visits. Medicines Take over-the-counter and prescription medicines  only as told by your doctor. Follow directions carefully. Do not skip doses of blood pressure medicine. The medicine does not work as well if you skip doses. Skipping doses also puts you at risk for problems. Ask your doctor about side effects or reactions to medicines that you should watch  for. Contact a doctor if: You think you are having a reaction to the medicine you are taking. You have headaches that keep coming back. You feel dizzy. You have swelling in your ankles. You have trouble with your vision. Get help right away if: You get a very bad headache. You start to feel mixed up (confused). You feel weak or numb. You feel faint. You have very bad pain in your: Chest. Belly (abdomen). You vomit more than once. You have trouble breathing. These symptoms may be an emergency. Get help right away. Call 911. Do not wait to see if the symptoms will go away. Do not drive yourself to the hospital. Summary Hypertension is another name for high blood pressure. High blood pressure forces your heart to work harder to pump blood. For most people, a normal blood pressure is less than 120/80. Making healthy choices can help lower blood pressure. If your blood pressure does not get lower with healthy choices, you may need to take medicine. This information is not intended to replace advice given to you by your health care provider. Make sure you discuss any questions you have with your health care provider. Document Revised: 07/05/2021 Document Reviewed: 07/05/2021 Elsevier Patient Education  2024 [employer].

## 2024-02-04 NOTE — Progress Notes (Signed)
 I,Alyssa Oliver, CMA,acting as a Neurosurgeon for Alyssa Dung, MD.,have documented all relevant documentation on the behalf of Alyssa Dung, MD,as directed by  Alyssa Dung, MD while in the presence of Alyssa Dung, MD.  Subjective:  Patient ID: Alyssa Oliver , female    DOB: 11-Jun-1976 , 48 y.o.   MRN: [id number]  Chief Complaint  Patient presents with   Hypertension    Patient presents today for bp follow up. She reports compliance with medications. Denies headache, chest pain & sob.  While here today she wants to further discuss FMLA. She feels it has been miscommunication about how to go about this process. She wants to make sure she goes about this the  right way.     HPI Discussed the use of AI scribe software for clinical note transcription with the patient, who gave verbal consent to proceed.  History of Present Illness Alyssa Oliver is a 48 year old female who presents with questions regarding her FMLA paperwork and follow-up on her blood pressure management.  The patient is seeking clarification on her FMLA paperwork due to a miscommunication regarding the initiation process. Human Resources started the process without her full understanding, leading her to initially take one week off work under the impression she had been approved for three weeks. She now seeks to amend the paperwork to reflect intermittent leave rather than consecutive days, as her current FMLA covers four days per month. She plans to bring the paperwork to be refilled and faxed to HR to resolve the discrepancy.  Her blood pressure has improved. She is actively participating in a wellness program and has attended three sessions. She is working on dietary changes, attempting to reduce carbohydrate intake, and is following a meal plan that includes eggs and cereals. She is trying to incorporate egg whites to manage cholesterol concerns. She has also started exercising, doing 30 to 45 minutes of activity in the  morning and afternoon since Monday, and reports feeling good with improved breathing. No allergies affecting her exercise routine.  She is planning to travel in June for her daughter's birthday and will be away for a month. Her husband walks daily, and she intends to maintain her exercise routine during her trip.     Hypertension This is a chronic problem. The current episode started more than 1 year ago. The problem has been gradually improving since onset. Pertinent negatives include no blurred vision, chest pain, palpitations or shortness of breath. Risk factors for coronary artery disease include obesity. The current treatment provides moderate improvement. There is no history of kidney disease. There is no history of hyperaldosteronism or hypercortisolism.     Past Medical History:  Diagnosis Date   Acute appendicitis 03/17/2019   Adnexal mass    Diabetes mellitus without complication (HCC)    Fibroid uterus    Headache    Hypertension    Hypothyroidism    Pre-diabetes    Thyroid  disease      Family History  Problem Relation Age of Onset   Colon cancer Neg Hx    Breast cancer Neg Hx    Ovarian cancer Neg Hx    Endometrial cancer Neg Hx    Pancreatic cancer Neg Hx    Prostate cancer Neg Hx      Current Outpatient Medications:    baclofen  (LIORESAL ) 10 MG tablet, Take 1 tablet (10 mg total) by mouth 3 (three) times daily., Disp: 30 tablet, Rfl: 2   FERROCITE 324  MG TABS tablet, Take 1 tablet by mouth daily. (Patient not taking: Reported on 01/06/2024), Disp: , Rfl:    hydrochlorothiazide  (HYDRODIURIL ) 25 MG tablet, TAKE 1 TABLET (25 MG TOTAL) BY MOUTH DAILY., Disp: 90 tablet, Rfl: 1   ibuprofen  (ADVIL ) 600 MG tablet, Take 1 tablet (600 mg total) by mouth every 6 (six) hours as needed., Disp: 30 tablet, Rfl: 0   Magnesium 200 MG CHEW, Chew by mouth., Disp: , Rfl:    metformin  (FORTAMET ) 500 MG (OSM) 24 hr tablet, Take 1 tablet (500 mg total) by mouth daily with breakfast.,  Disp: 30 tablet, Rfl: 0   SYNTHROID  75 MCG tablet, Take 1 tablet (75 mcg total) by mouth daily., Disp: 30 tablet, Rfl: 2   Vitamin D , Ergocalciferol , (DRISDOL ) 1.25 MG (50000 UNIT) CAPS capsule, Take 1 capsule (50,000 Units total) by mouth every 7 (seven) days., Disp: 5 capsule, Rfl: 0   Allergies  Allergen Reactions   Penicillins Itching   Sulfa Antibiotics Itching     Review of Systems  Constitutional: Negative.   Eyes:  Negative for blurred vision.  Respiratory: Negative.  Negative for shortness of breath.   Cardiovascular: Negative.  Negative for chest pain and palpitations.  Gastrointestinal: Negative.   Neurological: Negative.   Psychiatric/Behavioral: Negative.       Today's Vitals   02/04/24 1529  BP: 124/82  Pulse: 78  Temp: 98.5 F (36.9 C)  SpO2: 98%  Weight: 251 lb 3.2 oz (113.9 kg)  Height: 5\' 4"  (1.626 m)   Body mass index is 43.12 kg/m.  Wt Readings from Last 3 Encounters:  02/04/24 251 lb 3.2 oz (113.9 kg)  02/03/24 245 lb (111.1 kg)  01/20/24 245 lb (111.1 kg)     Objective:  Physical Exam Vitals and nursing note reviewed.  Constitutional:      Appearance: Normal appearance. She is obese.  HENT:     Head: Normocephalic and atraumatic.  Eyes:     Extraocular Movements: Extraocular movements intact.  Cardiovascular:     Rate and Rhythm: Normal rate and regular rhythm.     Heart sounds: Normal heart sounds.  Pulmonary:     Effort: Pulmonary effort is normal.     Breath sounds: Normal breath sounds.  Musculoskeletal:     Cervical back: Normal range of motion.  Skin:    General: Skin is warm.  Neurological:     General: No focal deficit present.     Mental Status: She is alert.  Psychiatric:        Mood and Affect: Mood normal.        Behavior: Behavior normal.         Assessment And Plan:  Essential hypertension, benign Assessment & Plan: Chronic, controlled.  She is encouraged to follow low sodium diet. She will c/w  hydrochlorothiazide  25mg  daily for now. Reminded to follow a low sodium diet.    Class 3 severe obesity due to excess calories with serious comorbidity and body mass index (BMI) of 40.0 to 44.9 in adult Assessment & Plan: Participating in wellness program with dietary changes and exercise. Encouraged strength training due to age-related muscle loss. - Continue wellness program and dietary modifications. - Incorporate strength training exercises during daily activities. - Encourage continuation of exercise routine, even while traveling.   Work stress Assessment & Plan: She needs her FMLA paperwork amended.  She is currently covered for four days per month under FMLA for stress-related leave. Needs HR to send forms and clarifications. Consider  short-term disability for extended leave. - Have HR send necessary forms and clarifications directly to office. - Consider applying for short-term disability if extended leave is needed. - Consult therapist if taking extended leave for stress-related issues.     Return if symptoms worsen or fail to improve.  Patient was given opportunity to ask questions. Patient verbalized understanding of the plan and was able to repeat key elements of the plan. All questions were answered to their satisfaction.    I, Alyssa Dung, MD, have reviewed all documentation for this visit. The documentation on 02/04/24 for the exam, diagnosis, procedures, and orders are all accurate and complete.   IF YOU HAVE BEEN REFERRED TO A SPECIALIST, IT MAY TAKE 1-2 WEEKS TO SCHEDULE/PROCESS THE REFERRAL. IF YOU HAVE NOT HEARD FROM US /SPECIALIST IN TWO WEEKS, PLEASE GIVE US  A CALL AT [phone].   THE PATIENT IS ENCOURAGED TO PRACTICE SOCIAL DISTANCING DUE TO THE COVID-19 PANDEMIC.

## 2024-02-08 DIAGNOSIS — Z566 Other physical and mental strain related to work: Secondary | ICD-10-CM | POA: Insufficient documentation

## 2024-02-08 NOTE — Assessment & Plan Note (Signed)
 She needs her FMLA paperwork amended.  She is currently covered for four days per month under FMLA for stress-related leave. Needs HR to send forms and clarifications. Consider short-term disability for extended leave. - Have HR send necessary forms and clarifications directly to office. - Consider applying for short-term disability if extended leave is needed. - Consult therapist if taking extended leave for stress-related issues.

## 2024-02-08 NOTE — Assessment & Plan Note (Signed)
 Previous labs reviewed, her A1c has been elevated in the past. I will check an A1c today. Reminded to avoid refined sugars including sugary drinks/foods and processed meats including bacon, sausages and deli meats.

## 2024-02-08 NOTE — Assessment & Plan Note (Signed)
 Participating in wellness program with dietary changes and exercise. Encouraged strength training due to age-related muscle loss. - Continue wellness program and dietary modifications. - Incorporate strength training exercises during daily activities. - Encourage continuation of exercise routine, even while traveling.

## 2024-02-08 NOTE — Assessment & Plan Note (Signed)
 Chronic, controlled.  She is encouraged to follow low sodium diet. She will c/w hydrochlorothiazide  25mg  daily for now. Reminded to follow a low sodium diet.

## 2024-02-09 ENCOUNTER — Ambulatory Visit (INDEPENDENT_AMBULATORY_CARE_PROVIDER_SITE_OTHER): Admitting: Family Medicine

## 2024-02-24 ENCOUNTER — Ambulatory Visit: Admitting: Bariatrics

## 2024-03-08 ENCOUNTER — Other Ambulatory Visit: Payer: Self-pay | Admitting: Internal Medicine

## 2024-03-09 ENCOUNTER — Other Ambulatory Visit: Payer: Self-pay

## 2024-03-09 MED ORDER — HYDROCHLOROTHIAZIDE 25 MG PO TABS: 25.0000 mg | ORAL_TABLET | Freq: Every day | ORAL | 1 refills | Status: AC

## 2024-03-12 ENCOUNTER — Other Ambulatory Visit: Payer: Self-pay | Admitting: Bariatrics

## 2024-04-19 ENCOUNTER — Ambulatory Visit: Admitting: Internal Medicine

## 2024-05-05 ENCOUNTER — Other Ambulatory Visit: Payer: Self-pay | Admitting: Internal Medicine

## 2024-05-13 ENCOUNTER — Ambulatory Visit: Admitting: Internal Medicine

## 2024-08-24 ENCOUNTER — Encounter: Payer: Self-pay | Admitting: Internal Medicine

## 2024-08-24 NOTE — Patient Instructions (Incomplete)

## 2024-08-24 NOTE — Progress Notes (Deleted)
 I,Eian Vandervelden T Emmitt, CMA,acting as a neurosurgeon for Catheryn LOISE Slocumb, MD.,have documented all relevant documentation on the behalf of Catheryn LOISE Slocumb, MD,as directed by  Catheryn LOISE Slocumb, MD while in the presence of Catheryn LOISE Slocumb, MD.  Subjective:    Patient ID: Alyssa Oliver , female    DOB: Jun 07, 1976 , 48 y.o.   MRN: [id number]  No chief complaint on file.   HPI  Patient presents today for annual exam. She reports compliance with medications. Denies headache, chest pain & SOB. GYN: Dr Delana. She has no specific concerns or complaints at this time.     Hypertension This is a chronic problem. The current episode started more than 1 year ago. The problem has been gradually improving since onset. Pertinent negatives include no blurred vision, chest pain, palpitations or shortness of breath. Risk factors for coronary artery disease include obesity. Past treatments include diuretics. The current treatment provides moderate improvement. There is no history of kidney disease. There is no history of hyperaldosteronism or hypercortisolism.     Past Medical History:  Diagnosis Date  . Acute appendicitis 03/17/2019  . Adnexal mass   . Diabetes mellitus without complication (HCC)   . Fibroid uterus   . Headache   . Hypertension   . Hypothyroidism   . Pre-diabetes   . Thyroid  disease      Family History  Problem Relation Age of Onset  . Colon cancer Neg Hx   . Breast cancer Neg Hx   . Ovarian cancer Neg Hx   . Endometrial cancer Neg Hx   . Pancreatic cancer Neg Hx   . Prostate cancer Neg Hx      Current Outpatient Medications:  .  baclofen  (LIORESAL ) 10 MG tablet, Take 1 tablet (10 mg total) by mouth 3 (three) times daily., Disp: 30 tablet, Rfl: 2 .  FERROCITE 324 MG TABS tablet, Take 1 tablet by mouth daily. (Patient not taking: Reported on 01/06/2024), Disp: , Rfl:  .  hydrochlorothiazide  (HYDRODIURIL ) 25 MG tablet, TAKE 1 TABLET (25 MG TOTAL) BY MOUTH DAILY., Disp: 90 tablet, Rfl: 1 .   hydrochlorothiazide  (HYDRODIURIL ) 25 MG tablet, Take 1 tablet (25 mg total) by mouth daily., Disp: 90 tablet, Rfl: 1 .  ibuprofen  (ADVIL ) 600 MG tablet, Take 1 tablet (600 mg total) by mouth every 6 (six) hours as needed., Disp: 30 tablet, Rfl: 0 .  levothyroxine  (SYNTHROID ) 75 MCG tablet, TAKE 1 TABLET BY MOUTH EVERY DAY, Disp: 90 tablet, Rfl: 2 .  Magnesium 200 MG CHEW, Chew by mouth., Disp: , Rfl:  .  metformin  (FORTAMET ) 500 MG (OSM) 24 hr tablet, Take 1 tablet (500 mg total) by mouth daily with breakfast., Disp: 30 tablet, Rfl: 0 .  Vitamin D , Ergocalciferol , (DRISDOL ) 1.25 MG (50000 UNIT) CAPS capsule, Take 1 capsule (50,000 Units total) by mouth every 7 (seven) days., Disp: 5 capsule, Rfl: 0   Allergies  Allergen Reactions  . Penicillins Itching  . Sulfa Antibiotics Itching      The patient states she uses {contraceptive methods:5051} for birth control. Patient's last menstrual period was 08/24/2021 (exact date).. {Dysmenorrhea-menorrhagia:21918}. Negative for: breast discharge, breast lump(s), breast pain and breast self exam. Associated symptoms include abnormal vaginal bleeding. Pertinent negatives include abnormal bleeding (hematology), anxiety, decreased libido, depression, difficulty falling sleep, dyspareunia, history of infertility, nocturia, sexual dysfunction, sleep disturbances, urinary incontinence, urinary urgency, vaginal discharge and vaginal itching. Diet regular.The patient states her exercise level is    . The patient's tobacco use is:  Social History   Tobacco Use  Smoking Status Never  Smokeless Tobacco Never  . She has been exposed to passive smoke. The patient's alcohol use is:  Social History   Substance and Sexual Activity  Alcohol Use Not Currently  . Additional information: Last pap ***, next one scheduled for ***.    Review of Systems  Constitutional: Negative.   HENT: Negative.    Eyes: Negative.  Negative for blurred vision.  Respiratory: Negative.   Negative for shortness of breath.   Cardiovascular: Negative.  Negative for chest pain and palpitations.  Gastrointestinal: Negative.   Endocrine: Negative.   Genitourinary: Negative.   Musculoskeletal: Negative.   Skin: Negative.   Allergic/Immunologic: Negative.   Neurological: Negative.   Hematological: Negative.   Psychiatric/Behavioral: Negative.       There were no vitals filed for this visit. There is no height or weight on file to calculate BMI.  Wt Readings from Last 3 Encounters:  02/04/24 251 lb 3.2 oz (113.9 kg)  02/03/24 245 lb (111.1 kg)  01/20/24 245 lb (111.1 kg)     Objective:  Physical Exam      Assessment And Plan:     Encounter for annual health examination  Essential hypertension, benign  Prediabetes  Primary hypothyroidism     No follow-ups on file. Patient was given opportunity to ask questions. Patient verbalized understanding of the plan and was able to repeat key elements of the plan. All questions were answered to their satisfaction.   Catheryn LOISE Slocumb, MD  I, Catheryn LOISE Slocumb, MD, have reviewed all documentation for this visit. The documentation on 08/24/24 for the exam, diagnosis, procedures, and orders are all accurate and complete.

## 2024-09-07 ENCOUNTER — Encounter: Payer: Self-pay | Admitting: Internal Medicine
